# Patient Record
Sex: Male | Born: 1995 | Race: White | Hispanic: No | Marital: Single | Smoking: Current some day smoker
Health system: Southern US, Community
[De-identification: ages and names within clinical notes are randomized; demographics above are authoritative.]

## PROBLEM LIST (undated history)

## (undated) DIAGNOSIS — Z22322 Carrier or suspected carrier of Methicillin resistant Staphylococcus aureus: Secondary | ICD-10-CM

## (undated) DIAGNOSIS — I499 Cardiac arrhythmia, unspecified: Secondary | ICD-10-CM

## (undated) DIAGNOSIS — H5500 Unspecified nystagmus: Secondary | ICD-10-CM

## (undated) HISTORY — DX: Carrier or suspected carrier of methicillin resistant Staphylococcus aureus: Z22.322

## (undated) HISTORY — PX: EYE SURGERY: SHX253

## (undated) HISTORY — DX: Unspecified nystagmus: H55.00

## (undated) HISTORY — DX: Cardiac arrhythmia, unspecified: I49.9

---
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
## 2016-03-17 ENCOUNTER — Ambulatory Visit (INDEPENDENT_AMBULATORY_CARE_PROVIDER_SITE_OTHER): Payer: Self-pay | Admitting: Family Medicine

## 2016-03-17 ENCOUNTER — Encounter: Payer: Self-pay | Admitting: Family Medicine

## 2016-03-17 VITALS — BP 132/72 | HR 85 | Temp 98.3°F | Ht 71.0 in | Wt 210.8 lb

## 2016-03-17 DIAGNOSIS — R232 Flushing: Secondary | ICD-10-CM

## 2016-03-17 DIAGNOSIS — F4010 Social phobia, unspecified: Secondary | ICD-10-CM

## 2016-03-17 DIAGNOSIS — F410 Panic disorder [episodic paroxysmal anxiety] without agoraphobia: Secondary | ICD-10-CM | POA: Insufficient documentation

## 2016-03-17 DIAGNOSIS — F32A Depression, unspecified: Secondary | ICD-10-CM | POA: Insufficient documentation

## 2016-03-17 DIAGNOSIS — F329 Major depressive disorder, single episode, unspecified: Secondary | ICD-10-CM

## 2016-03-17 LAB — BASIC METABOLIC PANEL
BUN: 11 mg/dL (ref 6–23)
CHLORIDE: 102 meq/L (ref 96–112)
CO2: 30 mEq/L (ref 19–32)
CREATININE: 0.82 mg/dL (ref 0.40–1.50)
Calcium: 9.8 mg/dL (ref 8.4–10.5)
GFR: 128.28 mL/min (ref >60.00)
GLUCOSE: 84 mg/dL (ref 70–99)
POTASSIUM: 4.1 meq/L (ref 3.5–5.1)
Sodium: 140 mEq/L (ref 135–145)

## 2016-03-17 LAB — TSH: TSH: 1.81 u[IU]/mL (ref 0.40–5.00)

## 2016-03-17 MED ORDER — PAROXETINE HCL 20 MG PO TABS: 20.0000 mg | ORAL_TABLET | Freq: Every day | ORAL | Status: DC

## 2016-03-17 MED ORDER — PROPRANOLOL HCL 10 MG PO TABS: 10.0000 mg | ORAL_TABLET | Freq: Two times a day (BID) | ORAL | Status: DC

## 2016-03-17 NOTE — Progress Notes (Signed)
[facility] at [facility] [address] [place], [place] [address] [phone]  Date:  03/17/2016   Name:  Xavier Olson   DOB:  March 16, 1996   MRN:  [id number]  PCP:  Abbe Amsterdam, MD    Chief Complaint: New Patient (Initial Visit)   History of Present Illness:  Xavier Olson is a 20 y.o. very pleasant male patient who presents with the following:  Here today as a new patient with concern of anxiety.  His mother is a pt of mine who I last saw a couple of years ago.  I have also seen his dad who unfortunately has a drug abuse and addiction problem.  Her mother and dad are currently getting divorced.  Xavier Olson is here today with his mom April who is supportive.   He may get what sounds like anxiety or panic attacks- he will develop some blotches on his skin if he gets anxious. He will feel burning and itching, a feeling of panic, like his heart is racing and like he just has to leave the situation immediately.  He will feel like he is outside of his body.  People notice his turning red, etc and he is embarrassed. This occurs most often in social situations such as school or youth group- he has stopped attending youth group which he always enjoyed.   He is getting this sort of attack once a day or so.  It can be due to a stressful situation or for no reason at all.    This has also occurred with driving which is a worry for his family  He has noted sx of anxiety - more social anxiety- for the last 3-4 years.  However this has gotten worse over the last several months.    He does feel like he may have some depression as well. He notes less interest in things that he used to enjoy.  His mother notes that Xavier Olson's twin brother is "living the life that Xavier Olson wants-" he has already graduated from high school and is moving on to adulthood while Xavier Olson had to repeat his senior year.   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  He is at school right  now- he is a senior this year.   He is still able to enjoy activities   He has had some heart palpitations but has seen cardiology in the past and found to be ok  Never had any LOC  His mom is his main support system.    Admits that he may engage in scratching or pinching his skin as the pain "make me feel calmer." Discussed any suicidal thoughts- Xavier Olson assures me and his mother that he is safe.  He may have fleeting thoughts of self harm but has no plans or intent to carry out any plan.   There are firearms at home but per his mother no ammunition.  I urged her to remove all firearms from the home at least for the time being.    There are no active problems to display for this patient.   Past Medical History  Diagnosis Date  . Nystagmus   . Irregular heart beat   . MRSA (methicillin resistant staph aureus) culture positive     Past Surgical History  Procedure Laterality Date  . Eye surgery  age 9    Social History  Substance Use Topics  . Smoking status: Never Smoker   . Smokeless tobacco: Never Used  .  Alcohol Use: No    Family History  Problem Relation Age of Onset  . Thyroid disease Maternal Grandmother   . Heart Problems Maternal Grandfather   . Thyroid disease Maternal Grandfather   . Thyroid disease Paternal Grandmother   . Heart disease Paternal Grandfather   . Thyroid disease Paternal Grandfather     No Known Allergies  Medication list has been reviewed and updated.  No current outpatient prescriptions on file prior to visit.   No current facility-administered medications on file prior to visit.    Review of Systems:  As per HPI- otherwise negative. No history of asthma  Physical Examination: Filed Vitals:   03/17/16 0951  BP: 132/72  Pulse: 85  Temp: 98.3 F (36.8 C)   Filed Vitals:   03/17/16 0951  Height:  (1.803 m)  Weight: 210 lb 12.8 oz (95.618 kg)   Body mass index is 29.41 kg/(m^2). Ideal Body Weight: Weight in (lb) to  have BMI = 25: 178.9  GEN: WDWN, NAD, Non-toxic, A & O x 3, well appearing but somewhat nervous young man accompanied by his mother.  Their interactions seem open and supportive HEENT: Atraumatic, Normocephalic. Neck supple. No masses, No LAD. Ears and Nose: No external deformity. CV: RRR, No M/G/R. No JVD. No thrill. No extra heart sounds. PULM: CTA B, no wheezes, crackles, rhonchi. No retractions. No resp. distress. No accessory muscle use. EXTR: No c/c/e NEURO Normal gait.  PSYCH: Normally interactive. Conversant.  Results for orders placed or performed in visit on 03/17/16  TSH  Result Value Ref Range   TSH 1.81 0.40 - 5.00 uIU/mL  Basic Metabolic Panel (BMET)  Result Value Ref Range   Sodium 140 135 - 145 mEq/L   Potassium 4.1 3.5 - 5.1 mEq/L   Chloride 102 96 - 112 mEq/L   CO2 30 19 - 32 mEq/L   Glucose, Bld 84 70 - 99 mg/dL   BUN 11 6 - 23 mg/dL   Creatinine, Ser 1.61 0.40 - 1.50 mg/dL   Calcium 9.8 8.4 - 09.6 mg/dL   GFR 045.40 >98.11 mL/min     Assessment and Plan: Panic attacks - Plan: PARoxetine (PAXIL) 20 MG tablet, propranolol (INDERAL) 10 MG tablet  Social anxiety disorder - Plan: PARoxetine (PAXIL) 20 MG tablet, propranolol (INDERAL) 10 MG tablet  Facial flushing - Plan: TSH, Basic Metabolic Panel (BMET)  Depression - Plan: PARoxetine (PAXIL) 20 MG tablet   Xavier Olson is here today to discuss social anxiety/ panic disorder and depression.   He has been through some family and personal stressors as his parents are splitting up- due at least in part to his father's drug problems.  In addition he is repeating 12th grade Decided to start paxil 20 mg for panic disorder/ depression and also prn propranolol to use for his vasomotor symptoms.  I hope that this will help him control his facial flushing which is very embarrassing to him.   Discussed risk of increased suicidal thoughts with SSRIs in young people with pt and his mother.  Xavier Olson agrees to seek help right away if  this is an issue, and his mother plans to watch him carefully They will contact me in one week with an update- sooner if any other concerns  Meds ordered this encounter  Medications  . PARoxetine (PAXIL) 20 MG tablet    Sig: Take 1 tablet (20 mg total) by mouth daily.    Dispense:  30 tablet    Refill:  3  .  propranolol (INDERAL) 10 MG tablet    Sig: Take 1 tablet (10 mg total) by mouth 2 (two) times daily. Use prn for shakiness    Dispense:  60 tablet    Refill:  3     Signed Abbe AmsterdamJessica Copland, MD

## 2016-03-17 NOTE — Patient Instructions (Signed)
We are going to start you on paxil 20 mg once a day to help with depression and anxiety You can also use the propranolol up to twice a day as needed for "social anxiety" or before going into a social situation We will do some basic labs today to make sure there is no other apparent reason for your symptoms Please set up your mychart and contact me with an update in one week  Remember if you have any suicidal thoughts or plans please contact someone for help- family, myself of the police

## 2016-03-21 ENCOUNTER — Telehealth: Payer: Self-pay | Admitting: Family Medicine

## 2016-03-21 NOTE — Telephone Encounter (Signed)
[facility] I need more details, will try back later.  Tried another number and did reach her- overall Ripken is doing better, he is not breaking out in the rash/ flushing. However he has had 3 minor, easily controlled nosebleeds- they are not sure if this is related.  Advised that this is possible but seems unlikely as he has only been on the medications for a few days.  Would advise to try vaseline or another moisturizer in his nose.  They will cut back on the paxil to 10 mg and keep me closely apprised as to his progress.  If any further bleeds they will let me know right away

## 2016-03-21 NOTE — Telephone Encounter (Signed)
Caller name:April Relationship to patient:mother Can be reached:[phone] Pharmacy:  Reason for call:Patient saw Dr Jeanella Crazeoplan on Monday  Started the paxil and blood pressure med on Monday evening.  He has gotten no sleep this week.  Should he stop the meds or one of the meds  She says to leave her a message she is out of town

## 2016-06-09 ENCOUNTER — Emergency Department (HOSPITAL_BASED_OUTPATIENT_CLINIC_OR_DEPARTMENT_OTHER)
Admission: EM | Admit: 2016-06-09 | Discharge: 2016-06-09 | Disposition: A | Discharge status: Home / Self Care | Payer: Self-pay | Attending: Emergency Medicine | Admitting: Emergency Medicine

## 2016-06-09 ENCOUNTER — Encounter (HOSPITAL_BASED_OUTPATIENT_CLINIC_OR_DEPARTMENT_OTHER): Payer: Self-pay | Admitting: Emergency Medicine

## 2016-06-09 ENCOUNTER — Other Ambulatory Visit: Payer: Self-pay

## 2016-06-09 ENCOUNTER — Ambulatory Visit (INDEPENDENT_AMBULATORY_CARE_PROVIDER_SITE_OTHER): Payer: Self-pay | Admitting: Family Medicine

## 2016-06-09 VITALS — BP 131/64 | HR 121 | Temp 98.6°F | Ht 71.0 in | Wt 225.4 lb

## 2016-06-09 DIAGNOSIS — L255 Unspecified contact dermatitis due to plants, except food: Secondary | ICD-10-CM

## 2016-06-09 DIAGNOSIS — M7989 Other specified soft tissue disorders: Secondary | ICD-10-CM

## 2016-06-09 DIAGNOSIS — F4010 Social phobia, unspecified: Secondary | ICD-10-CM

## 2016-06-09 DIAGNOSIS — L259 Unspecified contact dermatitis, unspecified cause: Secondary | ICD-10-CM | POA: Insufficient documentation

## 2016-06-09 DIAGNOSIS — F410 Panic disorder [episodic paroxysmal anxiety] without agoraphobia: Secondary | ICD-10-CM

## 2016-06-09 DIAGNOSIS — F32A Depression, unspecified: Secondary | ICD-10-CM

## 2016-06-09 DIAGNOSIS — R000 Tachycardia, unspecified: Secondary | ICD-10-CM | POA: Insufficient documentation

## 2016-06-09 DIAGNOSIS — F329 Major depressive disorder, single episode, unspecified: Secondary | ICD-10-CM

## 2016-06-09 DIAGNOSIS — R5383 Other fatigue: Secondary | ICD-10-CM | POA: Insufficient documentation

## 2016-06-09 LAB — CBC WITH DIFFERENTIAL/PLATELET
Basophils Absolute: 0 10*3/uL (ref 0.0–0.1)
Basophils Relative: 0 %
Eosinophils Absolute: 0.3 10*3/uL (ref 0.0–0.7)
Eosinophils Relative: 3 %
HEMATOCRIT: 52.5 % — AB (ref 39.0–52.0)
HEMOGLOBIN: 18.4 g/dL — AB (ref 13.0–17.0)
LYMPHS ABS: 1.4 10*3/uL (ref 0.7–4.0)
Lymphocytes Relative: 12 %
MCH: 31.5 pg (ref 26.0–34.0)
MCHC: 35 g/dL (ref 30.0–36.0)
MCV: 89.9 fL (ref 78.0–100.0)
MONO ABS: 1.2 10*3/uL — AB (ref 0.1–1.0)
MONOS PCT: 10 %
NEUTROS ABS: 8.7 10*3/uL — AB (ref 1.7–7.7)
NEUTROS PCT: 75 %
Platelets: 206 10*3/uL (ref 150–400)
RBC: 5.84 MIL/uL — ABNORMAL HIGH (ref 4.22–5.81)
RDW: 12.7 % (ref 11.5–15.5)
WBC: 11.6 10*3/uL — ABNORMAL HIGH (ref 4.0–10.5)

## 2016-06-09 LAB — COMPREHENSIVE METABOLIC PANEL
ALK PHOS: 55 U/L (ref 38–126)
ALT: 22 U/L (ref 17–63)
ANION GAP: 9 (ref 5–15)
AST: 24 U/L (ref 15–41)
Albumin: 4.4 g/dL (ref 3.5–5.0)
BILIRUBIN TOTAL: 1 mg/dL (ref 0.3–1.2)
BUN: 11 mg/dL (ref 6–20)
CALCIUM: 9.1 mg/dL (ref 8.9–10.3)
CO2: 30 mmol/L (ref 22–32)
Chloride: 98 mmol/L — ABNORMAL LOW (ref 101–111)
Creatinine, Ser: 0.92 mg/dL (ref 0.61–1.24)
GLUCOSE: 91 mg/dL (ref 65–99)
Potassium: 3.7 mmol/L (ref 3.5–5.1)
Sodium: 137 mmol/L (ref 135–145)
TOTAL PROTEIN: 7.7 g/dL (ref 6.5–8.1)

## 2016-06-09 MED ORDER — PAROXETINE HCL 20 MG PO TABS: 20.0000 mg | ORAL_TABLET | Freq: Every day | ORAL | Status: DC

## 2016-06-09 MED ORDER — METHYLPREDNISOLONE SODIUM SUCC 125 MG IJ SOLR
125.0000 mg | Freq: Once | INTRAMUSCULAR | Status: AC
  Administered 2016-06-09: 125 mg via INTRAVENOUS
  Filled 2016-06-09: qty 2

## 2016-06-09 MED ORDER — PROPRANOLOL HCL 10 MG PO TABS: 10.0000 mg | ORAL_TABLET | Freq: Two times a day (BID) | ORAL | Status: DC

## 2016-06-09 MED ORDER — HYDROXYZINE HCL 25 MG PO TABS: 25.0000 mg | ORAL_TABLET | Freq: Four times a day (QID) | ORAL | Status: DC | PRN

## 2016-06-09 MED ORDER — DIPHENHYDRAMINE HCL 50 MG/ML IJ SOLN
25.0000 mg | Freq: Once | INTRAMUSCULAR | Status: AC
  Administered 2016-06-09: 25 mg via INTRAVENOUS
  Filled 2016-06-09: qty 1

## 2016-06-09 MED ORDER — PREDNISONE 20 MG PO TABS: 60.0000 mg | ORAL_TABLET | Freq: Every day | ORAL | Status: DC

## 2016-06-09 MED ORDER — SODIUM CHLORIDE 0.9 % IV BOLUS (SEPSIS)
2000.0000 mL | Freq: Once | INTRAVENOUS | Status: AC
  Administered 2016-06-09: 2000 mL via INTRAVENOUS

## 2016-06-09 MED ORDER — FAMOTIDINE IN NACL 20-0.9 MG/50ML-% IV SOLN
20.0000 mg | Freq: Once | INTRAVENOUS | Status: AC
  Administered 2016-06-09: 20 mg via INTRAVENOUS
  Filled 2016-06-09: qty 50

## 2016-06-09 MED FILL — hydrOXYzine HCL 25 MG TABS: 25 | 5 days supply | Qty: 20 | Fill #0

## 2016-06-09 MED FILL — predniSONE 50 MG TABS: 50 | 7 days supply | Qty: 7 | Fill #0

## 2016-06-09 NOTE — Patient Instructions (Signed)
Please proceed downstairs to the ER for further evaluation  I hope that you feel better very soon!

## 2016-06-09 NOTE — Progress Notes (Signed)
Pre visit review using our clinic review tool, if applicable. No additional management support is needed unless otherwise documented below in the visit note. 

## 2016-06-09 NOTE — ED Provider Notes (Signed)
CSN: [id number]     Arrival date & time 06/09/16  1337 History   First MD Initiated Contact with Patient 06/09/16 1359     Chief Complaint  Patient presents with  . Rash     (Consider location/radiation/quality/duration/timing/severity/associated sxs/prior Treatment) HPI Patient transferred from primary care office upstairs. Has had 5 days of worsening rash. 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 It then spread to bilateral arms and lower extremities. Patient describes the rash as burning. This had decreased by mouth intake and appetite. Has been using over-the-counter steroid cream. Noted to be tachycardic by primary physician. Patient states he has not been able to take his regular medication for several days which includes propranolol and Paxil for anxiety. Denies any difficulty breathing or intraoral swelling. Past Medical History  Diagnosis Date  . Nystagmus   . Irregular heart beat   . MRSA (methicillin resistant staph aureus) culture positive    Past Surgical History  Procedure Laterality Date  . Eye surgery  age 54   Family History  Problem Relation Age of Onset  . Thyroid disease Maternal Grandmother   . Heart Problems Maternal Grandfather   . Thyroid disease Maternal Grandfather   . Thyroid disease Paternal Grandmother   . Heart disease Paternal Grandfather   . Thyroid disease Paternal Grandfather    Social History  Substance Use Topics  . Smoking status: Never Smoker   . Smokeless tobacco: Never Used  . Alcohol Use: No    Review of Systems  Constitutional: Positive for appetite change and fatigue. Negative for fever and chills.  HENT: Negative for facial swelling.   Respiratory: Negative for cough, shortness of breath, wheezing and stridor.   Cardiovascular: Negative for chest pain.  Gastrointestinal: Negative for nausea, vomiting, abdominal pain and diarrhea.  Musculoskeletal: Negative for back pain.  Skin: Positive for rash.  Neurological:  Negative for dizziness, weakness, light-headedness, numbness and headaches.  All other systems reviewed and are negative.     Allergies  Review of patient's allergies indicates no known allergies.  Home Medications   Prior to Admission medications   Medication Sig Start Date End Date Taking? Authorizing Provider  hydrOXYzine (ATARAX/VISTARIL) 25 MG tablet Take 1 tablet (25 mg total) by mouth every 6 (six) hours as needed for anxiety or itching. 06/09/16   Loren Racer, MD  PARoxetine (PAXIL) 20 MG tablet Take 1 tablet (20 mg total) by mouth daily. 06/09/16   Gwenlyn Found Copland, MD  predniSONE (DELTASONE) 20 MG tablet Take 3 tablets (60 mg total) by mouth daily. 06/09/16   Loren Racer, MD  propranolol (INDERAL) 10 MG tablet Take 1 tablet (10 mg total) by mouth 2 (two) times daily. Use prn for shakiness 06/09/16   Gwenlyn Found Copland, MD   BP 134/69 mmHg  Pulse 119  Temp(Src) 99.5 F (37.5 C)  Resp 21  Ht  (1.803 m)  Wt 225 lb (102.059 kg)  BMI 31.39 kg/m2  SpO2 100% Physical Exam  Constitutional: He is oriented to person, place, and time. He appears well-developed and well-nourished. No distress.  HENT:  Head: Normocephalic and atraumatic.  Mouth/Throat: Oropharynx is clear and moist. No oropharyngeal exudate.  Eyes: EOM are normal. Pupils are equal, round, and reactive to light.  Neck: Normal range of motion. Neck supple.  Cardiovascular: Regular rhythm.  Exam reveals no gallop and no friction rub.   No murmur heard. Tachycardia  Pulmonary/Chest: Effort normal and breath sounds normal. No respiratory distress. He has no wheezes.  He has no rales. He exhibits no tenderness.  Abdominal: Soft. Bowel sounds are normal. He exhibits no distension and no mass. There is no tenderness. There is no rebound and no guarding.  Musculoskeletal: Normal range of motion. He exhibits no edema or tenderness.  Neurological: He is alert and oriented to person, place, and time.  5/5 motor in  all extremities. Sensation is fully intact.  Skin: Skin is warm and dry. Rash noted. No erythema.  Patient has erythematous maculopapular rash covering bilateral lower extremities to the mid ankle and mid thigh. Similar rash to bilateral upper extremities. Patient has several spots of erythematous macular papular rash to the face. There is no intraoral or facial swelling. The rash the face appears less erythematous and more scaly than the other rashes. No obvious secondary infection  Psychiatric:  Anxious appearing  Nursing note and vitals reviewed.   ED Course  Procedures (including critical care time) Labs Review Labs Reviewed  CBC WITH DIFFERENTIAL/PLATELET - Abnormal; Notable for the following:    WBC 11.6 (*)    RBC 5.84 (*)    Hemoglobin 18.4 (*)    HCT 52.5 (*)    Neutro Abs 8.7 (*)    Monocytes Absolute 1.2 (*)    All other components within normal limits  COMPREHENSIVE METABOLIC PANEL - Abnormal; Notable for the following:    Chloride 98 (*)    All other components within normal limits    Imaging Review No results found. I have personally reviewed and evaluated these images and lab results as part of my medical decision-making.   EKG Interpretation   Date/Time:  Monday June 09 2016 14:32:06 EDT Ventricular Rate:  114 PR Interval:    QRS Duration: 94 QT Interval:  317 QTC Calculation: 437 R Axis:   123 Text Interpretation:  Sinus tachycardia Consider right atrial enlargement  Right axis deviation Borderline T abnormalities, inferior leads Borderline  ST elevation, anterolateral leads Confirmed by Ranae PalmsYELVERTON  MD, Angelica Wix  ([id number]) on 06/09/2016 2:43:47 PM Also confirmed by Ranae PalmsYELVERTON  MD, Alazay Leicht  ([id number]), editor [place], Jola BabinskiMarilyn [phone]([id number])  on 06/09/2016 2:48:17 PM      MDM   Final diagnoses:  Contact dermatitis  Tachycardia    Patient with severe contact dermatitis. We'll treat with systemic steroids and antihistamines. We'll check basic labs and give IV fluids.  Heart rate elevation may be due to dehydration, rebound tachycardia from propranolol, anxiety or infection.  Signed out to oncoming emergency physician pending recheck after IV fluid.  Loren Raceravid Dedria Endres, MD 06/11/16 1021

## 2016-06-09 NOTE — Progress Notes (Signed)
[facility] at [facility] [address], [address] [place], [place] [address] [phone]  Date:  06/09/2016   Name:  Xavier Olson   DOB:  1996-05-21   MRN:  [id number]  PCP:  Abbe Amsterdam, MD    Chief Complaint: Posion Lajoyce Corners   History of Present Illness:  Xavier Olson is a 20 y.o. 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  He noted onset of itching that night.  Started on his face and neck They tried a steroid cream on his face and neck which did help to clear up this area but it has since spread to most of his body including his legs, arms, and trunk.  So far no rash on the genitals "but it is heading that way"  The first rash was on the side of his face  His legs are swollen and painful- he cannot walk without a lot of calf pain.  His mom has him in a WC today He has felt nauseated but they have not noted any fever He has not had this in the past- has not had any rhus derm reaction that they can recall He does not think he was exposed to any other chemicals or irritants while working  He notes that his calves are quite swollen and painful. However his feet are not numb  He is on paxil and inderal for anxiety and panic attacks  Patient Active Problem List   Diagnosis Date Noted  . Social anxiety disorder 03/17/2016  . Panic attacks 03/17/2016  . Facial flushing 03/17/2016  . Depression 03/17/2016    Past Medical History  Diagnosis Date  . Nystagmus   . Irregular heart beat   . MRSA (methicillin resistant staph aureus) culture positive     Past Surgical History  Procedure Laterality Date  . Eye surgery  age 89    Social History  Substance Use Topics  . Smoking status: Never Smoker   . Smokeless tobacco: Never  Used  . Alcohol Use: No    Family History  Problem Relation Age of Onset  . Thyroid disease Maternal Grandmother   . Heart Problems Maternal Grandfather   . Thyroid disease Maternal Grandfather   . Thyroid disease Paternal Grandmother   . Heart disease Paternal Grandfather   . Thyroid disease Paternal Grandfather     No Known Allergies  Medication list has been reviewed and updated.  Current Outpatient Prescriptions on File Prior to Visit  Medication Sig Dispense Refill  . PARoxetine (PAXIL) 20 MG tablet Take 1 tablet (20 mg total) by mouth daily. 30 tablet 3  . propranolol (INDERAL) 10 MG tablet Take 1 tablet (10 mg total) by mouth 2 (two) times daily. Use prn for shakiness 60 tablet 3   No current facility-administered medications on file prior to visit.    Review of Systems:  As per HPI- otherwise negative.   Physical Examination: Filed Vitals:   06/09/16 1312  BP: 131/64  Pulse: 121  Temp: 98.6 F (37 C)   Filed Vitals:   06/09/16 1312  Weight: 225 lb 6.4 oz (102.241 kg)   Body mass index is 31.45 kg/(m^2). Ideal Body Weight:    GEN: WDWN, NAD, Non-toxic, A & O x 3, sitting in WC, does  not appear to feel well HEENT: Atraumatic, Normocephalic. Neck supple. No masses, No LAD.  Oropharynx wnl, no angioedema Ears and Nose: No external deformity. CV: RRR, No M/G/R. No JVD. No thrill. No extra heart sounds. PULM: CTA B, no wheezes, crackles, rhonchi. No retractions. No resp. distress. No accessory muscle use. ABD: S, NT, ND. No rebound. No HSM. EXTR: No c/c NEURO Normal gait.  PSYCH: Normally interactive. Conversant. Not depressed or anxious appearing.  Calm demeanor.  He has a widespread rash c/w rhus derm or other topical allergic derm which is confluent over his lower legs and covers most of his arms.  Some on the tunk Significant edema of his bilateral calves, worse on the left Normal DP pulses of both feet   Assessment and Plan: Rhus  dermatitis  Social anxiety disorder - Plan: PARoxetine (PAXIL) 20 MG tablet, propranolol (INDERAL) 10 MG tablet  Panic attacks - Plan: PARoxetine (PAXIL) 20 MG tablet, propranolol (INDERAL) 10 MG tablet  Depression - Plan: PARoxetine (PAXIL) 20 MG tablet  Leg swelling  Refilled his paxil and inderal today unfortunatley Xavier Olson appears to have a severe dermatitis with tachycardia and mild systemic symptoms. Likely needs hydration and labs as well as steroids.  Referral to ED now for further care.    Signed Abbe AmsterdamJessica Copland, MD

## 2016-06-09 NOTE — ED Notes (Signed)
Pt states that Thursday evening he noticed a rash tried to self medicate at home but has gotten worse and spreading every where and his ankles are swollen

## 2016-06-09 NOTE — Discharge Instructions (Signed)
Contact Dermatitis Dermatitis is redness, soreness, and swelling (inflammation) of the skin. Contact dermatitis is a reaction to certain substances that touch the skin. There are two types of contact dermatitis:   Irritant contact dermatitis. This type is caused by something that irritates your skin, such as dry hands from washing them too much. This type does not require previous exposure to the substance for a reaction to occur. This type is more common.  Allergic contact dermatitis. This type is caused by a substance that you are allergic to, such as a nickel allergy or poison ivy. This type only occurs if you have been exposed to the substance (allergen) before. Upon a repeat exposure, your body reacts to the substance. This type is less common. CAUSES  Many different substances can cause contact dermatitis. Irritant contact dermatitis is most commonly caused by exposure to:   Makeup.   Soaps.   Detergents.   Bleaches.   Acids.   Metal salts, such as nickel.  Allergic contact dermatitis is most commonly caused by exposure to:   Poisonous plants.   Chemicals.   Jewelry.   Latex.   Medicines.   Preservatives in products, such as clothing.  RISK FACTORS This condition is more likely to develop in:   People who have jobs that expose them to irritants or allergens.  People who have certain medical conditions, such as asthma or eczema.  SYMPTOMS  Symptoms of this condition may occur anywhere on your body where the irritant has touched you or is touched by you. Symptoms include:  Dryness or flaking.   Redness.   Cracks.   Itching.   Pain or a burning feeling.   Blisters.  Drainage of small amounts of blood or clear fluid from skin cracks. With allergic contact dermatitis, there may also be swelling in areas such as the eyelids, mouth, or genitals.  DIAGNOSIS  This condition is diagnosed with a medical history and physical exam. A patch skin test  may be performed to help determine the cause. If the condition is related to your job, you may need to see an occupational medicine specialist. TREATMENT Treatment for this condition includes figuring out what caused the reaction and protecting your skin from further contact. Treatment may also include:   Steroid creams or ointments. Oral steroid medicines may be needed in more severe cases.  Antibiotics or antibacterial ointments, if a skin infection is present.  Antihistamine lotion or an antihistamine taken by mouth to ease itching.  A bandage (dressing). HOME CARE INSTRUCTIONS Skin Care  Moisturize your skin as needed.   Apply cool compresses to the affected areas.  Try taking a bath with:  Epsom salts. Follow the instructions on the packaging. You can get these at your local pharmacy or grocery store.  Baking soda. Pour a small amount into the bath as directed by your health care provider.  Colloidal oatmeal. Follow the instructions on the packaging. You can get this at your local pharmacy or grocery store.  Try applying baking soda paste to your skin. Stir water into baking soda until it reaches a paste-like consistency.  Do not scratch your skin.  Bathe less frequently, such as every other day.  Bathe in lukewarm water. Avoid using hot water. Medicines  Take or apply over-the-counter and prescription medicines only as told by your health care provider.   If you were prescribed an antibiotic medicine, take or apply your antibiotic as told by your health care provider. Do not stop using the   antibiotic even if your condition starts to improve. General Instructions  Keep all follow-up visits as told by your health care provider. This is important.  Avoid the substance that caused your reaction. If you do not know what caused it, keep a journal to try to track what caused it. Write down:  What you eat.  What cosmetic products you use.  What you drink.  What  you wear in the affected area. This includes jewelry.  If you were given a dressing, take care of it as told by your health care provider. This includes when to change and remove it. SEEK MEDICAL CARE IF:   Your condition does not improve with treatment.  Your condition gets worse.  You have signs of infection such as swelling, tenderness, redness, soreness, or warmth in the affected area.  You have a fever.  You have new symptoms. SEEK IMMEDIATE MEDICAL CARE IF:   You have a severe headache, neck pain, or neck stiffness.  You vomit.  You feel very sleepy.  You notice red streaks coming from the affected area.  Your bone or joint underneath the affected area becomes painful after the skin has healed.  The affected area turns darker.  You have difficulty breathing.   This information is not intended to replace advice given to you by your health care provider. Make sure you discuss any questions you have with your health care provider.   Document Released: 12/05/2000 Document Revised: 08/29/2015 Document Reviewed: 04/25/2015 Elsevier Interactive Patient Education 2016 [employer]  

## 2016-06-12 ENCOUNTER — Encounter: Payer: Self-pay | Admitting: Family Medicine

## 2016-06-12 ENCOUNTER — Ambulatory Visit (INDEPENDENT_AMBULATORY_CARE_PROVIDER_SITE_OTHER): Payer: Self-pay | Admitting: Family Medicine

## 2016-06-12 VITALS — BP 102/68 | HR 82 | Temp 98.4°F | Ht 71.0 in | Wt 226.6 lb

## 2016-06-12 DIAGNOSIS — L255 Unspecified contact dermatitis due to plants, except food: Secondary | ICD-10-CM

## 2016-06-12 DIAGNOSIS — M25472 Effusion, left ankle: Secondary | ICD-10-CM

## 2016-06-12 NOTE — Progress Notes (Signed)
Pre visit review using our clinic review tool, if applicable. No additional management support is needed unless otherwise documented below in the visit note. 

## 2016-06-12 NOTE — Patient Instructions (Addendum)
It was good to see you today- I am glad that you are feeling so much better.  Let me know if your ankle does not get better in the next few days please let me know. Try propping it up when you can.  Take care!

## 2016-06-12 NOTE — Progress Notes (Signed)
[facility] [address] [place], [place] [address] [phone]  Date:  06/12/2016   Name:  Xavier Olson [place]   DOB:  1996-02-07   MRN:  [id number]  PCP:  Abbe AmsterdamOPLAND,JESSICA, MD    Chief Complaint: Foot Swelling   History of Present Illness:  Xavier Olson Wellbrock is a 20 y.o. very pleasant male patient who presents with the following:  He was here on Monday with severe PI- he was treated with systemic steroids and antihistamines, IV fluids.   He was given prednisone 60 for 6 days, atarax.    He is overall feeling much better- itching is tolerable.  He feels "great" with his steroids.  Overall much better He noted some swelling of the left foot and ankle this am- was present when he woke up. Has gotten a bit better as the day went on he thinks  The ankle does not hurt, he can walk again No fever He is eating well He is back on his regular meds as well.   Patient Active Problem List   Diagnosis Date Noted  . Social anxiety disorder 03/17/2016  . Panic attacks 03/17/2016  . Facial flushing 03/17/2016  . Depression 03/17/2016    Past Medical History  Diagnosis Date  . Nystagmus   . Irregular heart beat   . MRSA (methicillin resistant staph aureus) culture positive     Past Surgical History  Procedure Laterality Date  . Eye surgery  age 52    Social History  Substance Use Topics  . Smoking status: Never Smoker   . Smokeless tobacco: Never Used  . Alcohol Use: No    Family History  Problem Relation Age of Onset  . Thyroid disease Maternal Grandmother   . Heart Problems Maternal Grandfather   . Thyroid disease Maternal Grandfather   . Thyroid disease Paternal Grandmother   . Heart disease Paternal Grandfather   . Thyroid disease Paternal Grandfather     No Known Allergies  Medication list has been reviewed and updated.  Current Outpatient Prescriptions on File Prior to Visit  Medication Sig Dispense  Refill  . hydrOXYzine (ATARAX/VISTARIL) 25 MG tablet Take 1 tablet (25 mg total) by mouth every 6 (six) hours as needed for anxiety or itching. 20 tablet 0  . PARoxetine (PAXIL) 20 MG tablet Take 1 tablet (20 mg total) by mouth daily. 30 tablet 3  . predniSONE (DELTASONE) 20 MG tablet Take 3 tablets (60 mg total) by mouth daily. 15 tablet 0  . propranolol (INDERAL) 10 MG tablet Take 1 tablet (10 mg total) by mouth 2 (two) times daily. Use prn for shakiness 60 tablet 3   No current facility-administered medications on file prior to visit.    Review of Systems:  As per HPI- otherwise negative. NKI   Physical Examination: Filed Vitals:   06/12/16 1600  BP: 102/68  Pulse: 82  Temp: 98.4 F (36.9 C)   Filed Vitals:   06/12/16 1600  Height: 5\' 11"  (1.803 m)  Weight: 226 lb 9.6 oz (102.785 kg)   Body mass index is 31.62 kg/(m^2). Ideal Body Weight: Weight in (lb) to have BMI = 25: 178.9  GEN: WDWN, NAD, Non-toxic, A & O x 3, looks well.  Still has extensive PI rash but it is drying out HEENT: Atraumatic, Normocephalic. Neck supple. No masses, No LAD. Ears and Nose: No external deformity. CV: RRR, No M/G/R. No JVD. No thrill. No extra heart sounds.  PULM: CTA B, no wheezes, crackles, rhonchi. No retractions. No resp. distress. No accessory muscle use. ABD: S, NT, ND, +BS. No rebound. No HSM. EXTR: No c/c. Trace to 1+ edema in the left ankle. No tenderness with motion of the ankle.  Normal DP pulse, normal cap refill of the foot. No heat or redness  NEURO Normal gait.  PSYCH: Normally interactive. Conversant. Not depressed or anxious appearing.  Calm demeanor.    Assessment and Plan: Rhus dermatitis  Ankle swelling, left  Here today to follow-up PI; he is doing much better.  Will continue his prednisone.  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

## 2016-06-19 ENCOUNTER — Encounter: Payer: Self-pay | Admitting: Family Medicine

## 2016-06-19 ENCOUNTER — Telehealth: Payer: Self-pay | Admitting: Emergency Medicine

## 2016-06-19 MED ORDER — HYDROXYZINE HCL 25 MG PO TABS: 25.0000 mg | ORAL_TABLET | Freq: Four times a day (QID) | ORAL | Status: DC | PRN

## 2016-06-19 MED ORDER — PREDNISONE 20 MG PO TABS: 60.0000 mg | ORAL_TABLET | Freq: Every day | ORAL | Status: DC

## 2016-06-19 NOTE — Telephone Encounter (Signed)
error:[id number] ° °

## 2016-06-19 NOTE — Telephone Encounter (Signed)
Called pt's mother back. Informed her that refills for Prednisone and Hydroxyzine were [facility] to [facility]. Provider recommendations discussed with pt's mother. Pt's mother verbalized understanding.

## 2016-06-19 NOTE — Telephone Encounter (Signed)
Pt's mother April called in for pt. Pt went to work on Monday 6/26 and worked in the yard while being fully covered. Pt was only digging in the yard and did not come in contact with poison ivy as far as he knows but somehow is breaking out again with poison ivy.   Pt was seen on 6/22 for a severe case of poison ivy. Mom states that his current episode is not as bad yet but he does have several places where the rash is starting.   Pt's mother would like rx for Prednisone and Hydroxyzine to be sent in since this helped clear the poison ivy up the first time. Pt does not have insurance and would like to avoid having to come into the office. Pt will be going out of town and would like to treat this before it gets any worse.

## 2016-06-19 NOTE — Telephone Encounter (Signed)
OK to refill Prednisone and Hydroxyzine with same strength, same sig, same number. Have him take Hydroxyzine at least bid for next week as long it is not excessively sedating. If it is can do Cetirizine in am and Hydroxyzine in pm but cannot take within 6 hours of each other. Also Start Zantac/Rantidine 150 mg tab po bid for at least next week. Whenever exposed to poison wash with DAWN Dishwashing liquid in cool water, cleanse with Witch Hazel astringent. Wash all clothes exposed in hot water with detergent and distilled white vinegar.

## 2016-11-11 ENCOUNTER — Other Ambulatory Visit: Payer: Self-pay | Admitting: Emergency Medicine

## 2016-11-11 DIAGNOSIS — F410 Panic disorder [episodic paroxysmal anxiety] without agoraphobia: Secondary | ICD-10-CM

## 2016-11-11 DIAGNOSIS — F4010 Social phobia, unspecified: Secondary | ICD-10-CM

## 2016-11-11 MED ORDER — PAROXETINE HCL 20 MG PO TABS: 20.0000 mg | ORAL_TABLET | Freq: Every day | ORAL | 2 refills | Status: DC

## 2016-11-11 NOTE — Telephone Encounter (Signed)
[facility]. Sent refill for paroxetine 20 mg.

## 2017-04-17 ENCOUNTER — Other Ambulatory Visit: Payer: Self-pay | Admitting: Family Medicine

## 2017-04-17 DIAGNOSIS — F410 Panic disorder [episodic paroxysmal anxiety] without agoraphobia: Secondary | ICD-10-CM

## 2017-04-17 DIAGNOSIS — F4010 Social phobia, unspecified: Secondary | ICD-10-CM

## 2017-04-17 NOTE — Telephone Encounter (Signed)
Called and [facility] for April- I am glad to help with medications but will also need to see Xavier Olson soon as it has been nearly a year.  Also, would like to talk to him personally to see how he is doing as he is an adult.  Will try back later

## 2017-04-17 NOTE — Telephone Encounter (Signed)
Pt's mom April - [phone] called in to request a refill on her son's anxiety and bp medication. Mom says that she's not to sure of the names of the medications. Mom says that pt is currently without health insurance so if provider could prescribe a generic it would be helpful. Pt is also currently out of medication.    Pharmacy: [facility] on [address]

## 2017-04-19 NOTE — Telephone Encounter (Signed)
Called and spoke with his mom Xavier Olson.  Xavier Olson is not home and she is not quite sure of what he is taking currently.  He will be home tomorrow and I will try them again

## 2017-04-20 MED ORDER — PROPRANOLOL HCL 10 MG PO TABS: 10.0000 mg | ORAL_TABLET | Freq: Two times a day (BID) | ORAL | 3 refills | Status: DC

## 2017-04-20 MED ORDER — HYDROXYZINE HCL 25 MG PO TABS: 25.0000 mg | ORAL_TABLET | Freq: Four times a day (QID) | ORAL | 1 refills | Status: DC | PRN

## 2017-04-20 NOTE — Telephone Encounter (Signed)
Called back and was able to speak with Xavier Olson-   He has been out of his hydroxyzine and also the propranolol for about one month.   He is not sure if he ever took paxil for a significant amount of time- in any case he has not taken it recently He did feel like the hydroxyzine/ inderal were helpful for his anxiety and he would like to go back on these.  However, he feels like he is overall doing better in his work and social life and is happier overall   Asked him to see me in the next 2-3 months and he agrees to do so

## 2017-08-06 ENCOUNTER — Other Ambulatory Visit: Payer: Self-pay | Admitting: Family Medicine

## 2017-08-06 DIAGNOSIS — F410 Panic disorder [episodic paroxysmal anxiety] without agoraphobia: Secondary | ICD-10-CM

## 2017-08-06 DIAGNOSIS — F4010 Social phobia, unspecified: Secondary | ICD-10-CM

## 2017-09-28 ENCOUNTER — Other Ambulatory Visit: Payer: Self-pay | Admitting: Family Medicine

## 2017-09-28 DIAGNOSIS — F4010 Social phobia, unspecified: Secondary | ICD-10-CM

## 2017-09-28 DIAGNOSIS — F410 Panic disorder [episodic paroxysmal anxiety] without agoraphobia: Secondary | ICD-10-CM

## 2017-10-24 ENCOUNTER — Other Ambulatory Visit: Payer: Self-pay | Admitting: Family Medicine

## 2017-10-24 DIAGNOSIS — F4010 Social phobia, unspecified: Secondary | ICD-10-CM

## 2017-10-24 DIAGNOSIS — F410 Panic disorder [episodic paroxysmal anxiety] without agoraphobia: Secondary | ICD-10-CM

## 2017-12-08 NOTE — Progress Notes (Signed)
[facility] [address], [address] [place], [place] [address] [phone] [phone]  Date:  12/09/2017   Name:  Xavier Olson   DOB:  [birth year]   MRN:  [id number]  PCP:  Pearline Cablesopland, Jessica C, MD    Chief Complaint: No chief complaint on file.   History of Present Illness:  Xavier RisenCohle Bartha is a 21 y.o. very pleasant male patient who presents with the following:  I last saw him in summer of 2017 when he had a severe rhus derm reaction From my note in March of 2017:  Here today as a new patient with concern of anxiety.  His mother is a pt of mine who I last saw a couple of years ago.  I have also seen his dad who unfortunately has a drug abuse and addiction problem.  Her mother and dad are currently getting divorced.  Juanna CaoCohle is here today with his mom April who is supportive.  He may get what sounds like anxiety or panic attacks- he will develop some blotches on his skin if he gets anxious. He will feel burning and itching, a feeling of panic, like his heart is racing and like he just has to leave the situation immediately.  He will feel like he is outside of his body.  People notice his turning red, etc and he is embarrassed. This occurs most often in social situations such as school or youth group- he has stopped attending youth group which he always enjoyed.  He is getting this sort of attack once a day or so.  It can be due to a stressful situation or for no reason at all.   This has also occurred with driving which is a worry for his family He has noted sx of anxiety - more social anxiety- for the last 3-4 years.  However this has gotten worse over the last several months.   He does feel like he may have some depression as well. He notes less interest in things that he used to enjoy.  His mother notes that Berthel's twin brother is "living the life that Maxum wants-" he has already graduated from high school and is moving on to adulthood  while Chelsey had to repeat his senior year.  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  He is at school right now- he is a senior this year.   He is still able to enjoy activities  He has had some heart palpitations but has seen cardiology in the past and found to be ok  I treated him with paxil and propanolol at that time. However it does not sound like he used the paxil for long- he was using hydroxyzine and propranolol   He has been out of his meds for about 3 months. 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 He has noted that his anxiety is more frequent since he ran out of meds Also the medication seems to prevent the hot, itchy feeling rash/ flushing he will get on his chest when he is upset  No SI   Patient Active Problem List   Diagnosis Date Noted  . Social anxiety disorder 03/17/2016  . Panic attacks 03/17/2016  . Facial flushing 03/17/2016  .  Depression 03/17/2016    Past Medical History:  Diagnosis Date  . Irregular heart beat   . MRSA (methicillin resistant staph aureus) culture positive   . Nystagmus     Past Surgical History:  Procedure Laterality Date  . EYE SURGERY  age 7    Social History   Tobacco Use  . Smoking status: Never Smoker  . Smokeless tobacco: Never Used  Substance Use Topics  . Alcohol use: No    Alcohol/week: 0.0 oz  . Drug use: No    Family History  Problem Relation Age of Onset  . Thyroid disease Maternal Grandmother   . Heart Problems Maternal Grandfather   . Thyroid disease Maternal Grandfather   . Thyroid disease Paternal Grandmother   . Heart disease Paternal Grandfather   . Thyroid disease Paternal Grandfather     No Known Allergies  Medication list has been reviewed and updated.  Current Outpatient Medications on File Prior to Visit   Medication Sig Dispense Refill  . hydrOXYzine (ATARAX/VISTARIL) 25 MG tablet Take 1 tablet (25 mg total) by mouth every 6 (six) hours as needed for anxiety or itching. 40 tablet 0  . propranolol (INDERAL) 10 MG tablet TAKE 1 TABLET (10 MG TOTAL) BY MOUTH 2 (TWO) TIMES DAILY. USE AS NEEDED FOR FOR SHAKINESS 60 tablet 3   No current facility-administered medications on file prior to visit.     Review of Systems:  As per HPI- otherwise negative.   Physical Examination: Vitals:   12/09/17 1126  BP: (!) 142/56  Pulse: 66  Resp: 18  Temp: 98.2 F (36.8 C)  SpO2: 98%   Vitals:   12/09/17 1126  Weight: 256 lb (116.1 kg)  Height: 5\' 9"  (1.753 m)   Body mass index is 37.8 kg/m. Ideal Body Weight: Weight in (lb) to have BMI = 25: 168.9  GEN: WDWN, NAD, Non-toxic, A & O x 3, obese, otherwise looks well HEENT: Atraumatic, Normocephalic. Neck supple. No masses, No LAD. Ears and Nose: No external deformity. CV: RRR, No M/G/R. No JVD. No thrill. No extra heart sounds. PULM: CTA B, no wheezes, crackles, rhonchi. No retractions. No resp. distress. No accessory muscle use. EXTR: No c/c/e NEURO Normal gait.  PSYCH: Normally interactive. Conversant. Not depressed or anxious appearing.  Calm demeanor.    Assessment and Plan: Social anxiety disorder - Plan: hydrOXYzine (ATARAX/VISTARIL) 25 MG tablet, propranolol (INDERAL) 10 MG tablet  Panic attacks - Plan: hydrOXYzine (ATARAX/VISTARIL) 25 MG tablet, propranolol (INDERAL) 10 MG tablet  Follow-up after 18 month absence Treat with hydroxyzine and propranolol for his anxiety and to prevent panic attack  Asked him to come back in a couple of months so we can discuss in further detail.  He was about 10 minutes late for his 15 minute appt today- reminded him to come in plenty of time so we will have the full allotted time to discuss his concerns   Signed Abbe AmsterdamJessica Copland, MD

## 2017-12-09 ENCOUNTER — Encounter: Payer: Self-pay | Admitting: Family Medicine

## 2017-12-09 ENCOUNTER — Ambulatory Visit (INDEPENDENT_AMBULATORY_CARE_PROVIDER_SITE_OTHER): Payer: Self-pay | Admitting: Family Medicine

## 2017-12-09 DIAGNOSIS — F410 Panic disorder [episodic paroxysmal anxiety] without agoraphobia: Secondary | ICD-10-CM

## 2017-12-09 DIAGNOSIS — F4010 Social phobia, unspecified: Secondary | ICD-10-CM

## 2017-12-09 MED ORDER — PROPRANOLOL HCL 10 MG PO TABS: ORAL_TABLET | ORAL | 6 refills | Status: DC

## 2017-12-09 MED ORDER — HYDROXYZINE HCL 25 MG PO TABS: 25.0000 mg | ORAL_TABLET | Freq: Every day | ORAL | 6 refills | Status: DC | PRN

## 2017-12-09 NOTE — Patient Instructions (Signed)
Good to see you today- I am glad that you are doing ok! I refilled your propranolol which you can take twice a day if you like- or once is also ok; this should help with anxiety and flushing of your skin I also refilled the hydroxyzine for anxiety  If you have not already, please get your annual flu shot at your pharmacy Let's meet again in about 2 months to see how you are doing and talk in more detail Take care!

## 2018-06-24 ENCOUNTER — Other Ambulatory Visit: Payer: Self-pay | Admitting: Family Medicine

## 2018-06-24 DIAGNOSIS — F4010 Social phobia, unspecified: Secondary | ICD-10-CM

## 2018-06-24 DIAGNOSIS — F410 Panic disorder [episodic paroxysmal anxiety] without agoraphobia: Secondary | ICD-10-CM

## 2018-07-18 ENCOUNTER — Other Ambulatory Visit: Payer: Self-pay | Admitting: Family Medicine

## 2018-07-18 DIAGNOSIS — F4010 Social phobia, unspecified: Secondary | ICD-10-CM

## 2018-07-18 DIAGNOSIS — F410 Panic disorder [episodic paroxysmal anxiety] without agoraphobia: Secondary | ICD-10-CM

## 2018-08-17 ENCOUNTER — Other Ambulatory Visit: Payer: Self-pay | Admitting: Family Medicine

## 2018-08-17 DIAGNOSIS — F4010 Social phobia, unspecified: Secondary | ICD-10-CM

## 2018-08-17 DIAGNOSIS — F410 Panic disorder [episodic paroxysmal anxiety] without agoraphobia: Secondary | ICD-10-CM

## 2018-08-22 ENCOUNTER — Other Ambulatory Visit: Payer: Self-pay | Admitting: Family Medicine

## 2018-08-22 DIAGNOSIS — F4010 Social phobia, unspecified: Secondary | ICD-10-CM

## 2018-08-22 DIAGNOSIS — F410 Panic disorder [episodic paroxysmal anxiety] without agoraphobia: Secondary | ICD-10-CM

## 2018-08-27 ENCOUNTER — Other Ambulatory Visit: Payer: Self-pay | Admitting: Family Medicine

## 2018-08-27 DIAGNOSIS — F4010 Social phobia, unspecified: Secondary | ICD-10-CM

## 2018-08-27 DIAGNOSIS — F410 Panic disorder [episodic paroxysmal anxiety] without agoraphobia: Secondary | ICD-10-CM

## 2018-09-17 ENCOUNTER — Other Ambulatory Visit: Payer: Self-pay | Admitting: Family Medicine

## 2018-09-17 DIAGNOSIS — F4010 Social phobia, unspecified: Secondary | ICD-10-CM

## 2018-09-17 DIAGNOSIS — F410 Panic disorder [episodic paroxysmal anxiety] without agoraphobia: Secondary | ICD-10-CM

## 2018-09-23 ENCOUNTER — Other Ambulatory Visit: Payer: Self-pay | Admitting: Family Medicine

## 2018-09-23 DIAGNOSIS — F410 Panic disorder [episodic paroxysmal anxiety] without agoraphobia: Secondary | ICD-10-CM

## 2018-09-23 DIAGNOSIS — F4010 Social phobia, unspecified: Secondary | ICD-10-CM

## 2018-10-23 ENCOUNTER — Other Ambulatory Visit: Payer: Self-pay | Admitting: Family Medicine

## 2018-10-23 DIAGNOSIS — F4010 Social phobia, unspecified: Secondary | ICD-10-CM

## 2018-10-23 DIAGNOSIS — F410 Panic disorder [episodic paroxysmal anxiety] without agoraphobia: Secondary | ICD-10-CM

## 2018-11-22 ENCOUNTER — Other Ambulatory Visit: Payer: Self-pay | Admitting: Family Medicine

## 2018-11-22 DIAGNOSIS — F4010 Social phobia, unspecified: Secondary | ICD-10-CM

## 2018-11-22 DIAGNOSIS — F410 Panic disorder [episodic paroxysmal anxiety] without agoraphobia: Secondary | ICD-10-CM

## 2018-12-23 ENCOUNTER — Other Ambulatory Visit: Payer: Self-pay | Admitting: Family Medicine

## 2018-12-23 DIAGNOSIS — F410 Panic disorder [episodic paroxysmal anxiety] without agoraphobia: Secondary | ICD-10-CM

## 2018-12-23 DIAGNOSIS — F4010 Social phobia, unspecified: Secondary | ICD-10-CM

## 2019-01-22 ENCOUNTER — Other Ambulatory Visit: Payer: Self-pay | Admitting: Family Medicine

## 2019-01-22 DIAGNOSIS — F4010 Social phobia, unspecified: Secondary | ICD-10-CM

## 2019-01-22 DIAGNOSIS — F410 Panic disorder [episodic paroxysmal anxiety] without agoraphobia: Secondary | ICD-10-CM

## 2019-02-28 ENCOUNTER — Other Ambulatory Visit: Payer: Self-pay | Admitting: Family Medicine

## 2019-02-28 DIAGNOSIS — F4010 Social phobia, unspecified: Secondary | ICD-10-CM

## 2019-02-28 DIAGNOSIS — F410 Panic disorder [episodic paroxysmal anxiety] without agoraphobia: Secondary | ICD-10-CM

## 2019-03-20 ENCOUNTER — Other Ambulatory Visit: Payer: Self-pay | Admitting: Family Medicine

## 2019-03-20 DIAGNOSIS — F410 Panic disorder [episodic paroxysmal anxiety] without agoraphobia: Secondary | ICD-10-CM

## 2019-03-20 DIAGNOSIS — F4010 Social phobia, unspecified: Secondary | ICD-10-CM

## 2019-03-22 ENCOUNTER — Other Ambulatory Visit: Payer: Self-pay | Admitting: Family Medicine

## 2019-03-22 DIAGNOSIS — F410 Panic disorder [episodic paroxysmal anxiety] without agoraphobia: Secondary | ICD-10-CM

## 2019-03-22 DIAGNOSIS — F4010 Social phobia, unspecified: Secondary | ICD-10-CM

## 2019-03-24 ENCOUNTER — Other Ambulatory Visit: Payer: Self-pay

## 2019-03-24 ENCOUNTER — Ambulatory Visit (INDEPENDENT_AMBULATORY_CARE_PROVIDER_SITE_OTHER): Payer: 59 | Admitting: Family Medicine

## 2019-03-24 DIAGNOSIS — F4010 Social phobia, unspecified: Secondary | ICD-10-CM

## 2019-03-24 DIAGNOSIS — F410 Panic disorder [episodic paroxysmal anxiety] without agoraphobia: Secondary | ICD-10-CM

## 2019-03-24 MED ORDER — HYDROXYZINE HCL 25 MG PO TABS: 25.0000 mg | ORAL_TABLET | Freq: Every day | ORAL | 3 refills | Status: DC | PRN

## 2019-03-24 MED ORDER — PROPRANOLOL HCL 10 MG PO TABS: ORAL_TABLET | ORAL | 3 refills | Status: DC

## 2019-03-24 NOTE — Progress Notes (Signed)
[facility] [address] [place], [place] [address] [phone]  Date:  03/24/2019   Name:  Xavier Olson   DOB:  Nov 08, 1996   MRN:  [id number]  PCP:  Pearline Cables, MD    Chief Complaint: No chief complaint on file.   History of Present Illness:  Xavier Olson is a 23 y.o. very pleasant male patient who presents with the following:  webex visit today- pt ID confirmed with name and DOB Last seen by myself in December of 2018 with concern of social anxiety His mother April is my patient as well. At our last visit he was out of his medications.  I started him on Paxil but he did not take it for long.  He was taking hydroxyzine and propranolol for a while, and these seem to work for him I put him back on hydroxyzine in propranolol in December 2018, and asked him to follow-up in 2 months.  He is still working at [employer].  He notes that he is doing well overall, given current Covidcrisis.  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    He is not a smoker He is exercising at home most days since the gyms have been closed  We last got labs in 2017 He is also due for a tetanus and HIV screening  Patient Active Problem List   Diagnosis Date Noted  . Social anxiety disorder 03/17/2016  . Panic attacks 03/17/2016  . Facial flushing 03/17/2016  . Depression 03/17/2016    Past Medical History:  Diagnosis Date  . Irregular heart beat   . MRSA (methicillin resistant staph aureus) culture positive   . Nystagmus     Past Surgical History:  Procedure  Laterality Date  . EYE SURGERY  age 83    Social History   Tobacco Use  . Smoking status: Never Smoker  . Smokeless tobacco: Never Used  Substance Use Topics  . Alcohol use: No    Alcohol/week: 0.0 standard drinks  . Drug use: No    Family History  Problem Relation Age of Onset  . Thyroid disease Maternal Grandmother   . Heart Problems Maternal Grandfather   . Thyroid disease Maternal Grandfather   . Thyroid disease Paternal Grandmother   . Heart disease Paternal Grandfather   . Thyroid disease Paternal Grandfather     No Known Allergies  Medication list has been reviewed and updated.  Current Outpatient Medications on File Prior to Visit  Medication Sig Dispense Refill  . hydrOXYzine (ATARAX/VISTARIL) 25 MG tablet TAKE 1 TABLET (25 MG TOTAL) BY MOUTH DAILY AS NEEDED FOR ANXIETY OR ITCHING. 14 tablet 0  . propranolol (INDERAL) 10 MG tablet TAKE 1 TABLET (10 MG TOTAL) BY MOUTH 2 (TWO) TIMES DAILY. USE AS NEEDED FOR FOR SHAKINESS 60 tablet 3   No current facility-administered medications on file prior to visit.     Review of Systems:  As per HPI- otherwise negative.   Physical Examination: There were no vitals filed for this visit.  There were no vitals filed for this visit. There is no height or weight on file to calculate BMI. Ideal Body Weight:    Patient observed over WebEx.  He looks well, normal weight.  No cough or wheezing, no tachypnea observed  Assessment and Plan: Social anxiety disorder - Plan: hydrOXYzine (ATARAX/VISTARIL) 25 MG tablet, propranolol (INDERAL) 10 MG tablet  Panic attacks - Plan: hydrOXYzine (ATARAX/VISTARIL) 25 MG tablet, propranolol (INDERAL) 10 MG tablet  Virtual visit today due to COVID-19 pandemic.  Patient is doing well, he is stable on his current medications.  He is taking Atarax 25 mg once daily, and propranolol 10 twice daily.  I have refilled these medications, he will continue his current regimen I have asked him to see me in  about 6 months, so we can have a face-to-face visit, catch up on labs, immunizations. Patient has not set up a MyChart, so I will printed after visit summary and mail to him  Signed Abbe Amsterdam, MD

## 2019-03-24 NOTE — Patient Instructions (Signed)
It was great to talk with you today. As we discussed, pleasedto come and see me in the office in about 4-6 months.  We need to check up on routine labs and immunizations  You also might wish to set up your MyChart account, instructions are below

## 2019-10-27 ENCOUNTER — Other Ambulatory Visit: Payer: Self-pay

## 2019-10-27 ENCOUNTER — Encounter (HOSPITAL_COMMUNITY): Payer: Self-pay | Admitting: Emergency Medicine

## 2019-10-27 ENCOUNTER — Ambulatory Visit: Payer: Self-pay

## 2019-10-27 ENCOUNTER — Ambulatory Visit (INDEPENDENT_AMBULATORY_CARE_PROVIDER_SITE_OTHER): Payer: 59

## 2019-10-27 ENCOUNTER — Ambulatory Visit (HOSPITAL_COMMUNITY)
Admission: EM | Admit: 2019-10-27 | Discharge: 2019-10-27 | Disposition: A | Discharge status: Home / Self Care | Payer: 59 | Attending: Emergency Medicine | Admitting: Emergency Medicine

## 2019-10-27 DIAGNOSIS — R1013 Epigastric pain: Secondary | ICD-10-CM

## 2019-10-27 LAB — POCT URINALYSIS DIP (DEVICE)
Bilirubin Urine: NEGATIVE
Glucose, UA: NEGATIVE mg/dL
Hgb urine dipstick: NEGATIVE
Ketones, ur: NEGATIVE mg/dL
Leukocytes,Ua: NEGATIVE
Nitrite: NEGATIVE
Protein, ur: NEGATIVE mg/dL
Specific Gravity, Urine: 1.02 (ref 1.005–1.030)
Urobilinogen, UA: 0.2 mg/dL (ref 0.0–1.0)
pH: 7 (ref 5.0–8.0)

## 2019-10-27 MED ORDER — NAPROXEN 500 MG PO TABS: 500.0000 mg | ORAL_TABLET | Freq: Two times a day (BID) | ORAL | 0 refills | Status: DC

## 2019-10-27 NOTE — ED Provider Notes (Signed)
[name]    CSN: [id number] Arrival date & time: 10/27/19  1800      History   Chief Complaint Chief Complaint  Patient presents with  . Abdominal Pain    HPI Xavier Olson is a 23 y.o. male.   Pt states that he has upper abd pain near epigastic area after he eats for the past few weeks now. Denies any chest pain, no sob. Only hx of kidney stones in the past. Denies any urinary sx. Has not seen a pcp for this. Was taking vinegar pills before with no relief. Last BM 3 hrs ago normal no dark stools      Past Medical History:  Diagnosis Date  . Irregular heart beat   . MRSA (methicillin resistant staph aureus) culture positive   . Nystagmus     Patient Active Problem List   Diagnosis Date Noted  . Social anxiety disorder 03/17/2016  . Panic attacks 03/17/2016  . Facial flushing 03/17/2016  . Depression 03/17/2016    Past Surgical History:  Procedure Laterality Date  . EYE SURGERY  age 4       Home Medications    Prior to Admission medications   Medication Sig Start Date End Date Taking? Authorizing Provider  hydrOXYzine (ATARAX/VISTARIL) 25 MG tablet Take 1 tablet (25 mg total) by mouth daily as needed for anxiety or itching. 03/24/19  Yes Copland, Gay Filler, MD  propranolol (INDERAL) 10 MG tablet Take 1 tablet by mouth twice daily as needed for shakiness or anxiety 03/24/19  Yes Copland, Gay Filler, MD  hydrOXYzine (ATARAX/VISTARIL) 25 MG tablet TAKE 1 TABLET (25 MG TOTAL) BY MOUTH DAILY AS NEEDED FOR ANXIETY OR ITCHING. 03/28/19   Copland, Gay Filler, MD  naproxen (NAPROSYN) 500 MG tablet Take 1 tablet (500 mg total) by mouth 2 (two) times daily. 10/27/19   Marney Setting, NP    Family History Family History  Problem Relation Age of Onset  . Thyroid disease Maternal Grandmother   . Heart Problems Maternal Grandfather   . Thyroid disease Maternal Grandfather   . Thyroid disease Paternal Grandmother   . Heart disease Paternal Grandfather   .  Thyroid disease Paternal Grandfather     Social History Social History   Tobacco Use  . Smoking status: Never Smoker  . Smokeless tobacco: Never Used  Substance Use Topics  . Alcohol use: No    Alcohol/week: 0.0 standard drinks  . Drug use: No     Allergies   Patient has no known allergies.   Review of Systems Review of Systems  Constitutional: Positive for appetite change.  Respiratory: Negative.   Cardiovascular: Negative.   Gastrointestinal: Positive for abdominal pain and nausea.  Genitourinary: Negative.   Musculoskeletal: Negative.   Neurological: Negative.      Physical Exam Triage Vital Signs ED Triage Vitals  Enc Vitals Group     BP 10/27/19 1822 126/73     Pulse Rate 10/27/19 1822 61     Resp 10/27/19 1822 18     Temp 10/27/19 1822 98.2 F (36.8 C)     Temp Source 10/27/19 1822 Oral     SpO2 10/27/19 1822 100 %     Weight --      Height --      Head Circumference --      Peak Flow --      Pain Score 10/27/19 1819 5     Pain Loc --      Pain Edu? --  Excl. in GC? --    No data found.  Updated Vital Signs BP 126/73 (BP Location: Right Arm)   Pulse 61   Temp 98.2 F (36.8 C) (Oral)   Resp 18   SpO2 100%   Visual Acuity Right Eye Distance:   Left Eye Distance:   Bilateral Distance:    Right Eye Near:   Left Eye Near:    Bilateral Near:     Physical Exam Cardiovascular:     Rate and Rhythm: Normal rate.  Pulmonary:     Effort: Pulmonary effort is normal.  Abdominal:     General: Abdomen is flat. Bowel sounds are normal.     Palpations: Abdomen is soft.     Tenderness: There is abdominal tenderness in the epigastric area. Negative signs include Murphy's sign and McBurney's sign.  Skin:    General: Skin is warm.  Neurological:     General: No focal deficit present.     Mental Status: He is alert.      UC Treatments / Results  Labs (all labs ordered are listed, but only abnormal results are displayed) Labs Reviewed   POCT URINALYSIS DIP (DEVICE)    EKG   Radiology Dg Abdomen 1 View  Result Date: 10/27/2019 CLINICAL DATA:  Abdomen pain EXAM: ABDOMEN - 1 VIEW COMPARISON:  None. FINDINGS: The bowel gas pattern is normal. No radio-opaque calculi or other significant radiographic abnormality are seen. IMPRESSION: Negative. Electronically Signed   By: Jasmine Pang M.D.   On: 10/27/2019 19:27    Procedures Procedures (including critical care time)  Medications Ordered in UC Medications - No data to display  Initial Impression / Assessment and Plan / UC Course  I have reviewed the triage vital signs and the nursing notes.  Pertinent labs & imaging results that were available during my care of the patient were reviewed by me and considered in my medical decision making (see chart for details).    Discussed needing to have an ultrasound completed to r/o gallstones will need to see pcp for this.   Final Clinical Impressions(s) / UC Diagnoses   Final diagnoses:  Epigastric pain     Discharge Instructions     Will need to see pcp for outpt ultrasound  Monitor food intake  Take pain meds as needed  Avoid spicy foods  May need to see GI for further testing     ED Prescriptions    Medication Sig Dispense Auth. Provider   naproxen (NAPROSYN) 500 MG tablet Take 1 tablet (500 mg total) by mouth 2 (two) times daily. 30 tablet Coralyn Mark, NP     PDMP not reviewed this encounter.   Coralyn Mark, NP 10/29/19 [phone]

## 2019-10-27 NOTE — ED Triage Notes (Signed)
Complains of abdominal pain for a week.  History of the same, resolved on its own.  This time pain is sharper.  Pain is center epigastric area.  Patient notices it after eating and at night time.  denies nausea or vomiting, reports sweating with pain.  Reports normal bm today.

## 2019-10-27 NOTE — Telephone Encounter (Signed)
Patient called stating that he has had abdominal pain he rates at 5. He states that it is in the center of his abdomin just below his rib line. It seem to come on after he eats. He states that he has has pain before and it went away.  This time he feels it is worse lasting hours at night. He states the pain makes him sweat. He denies chest pain, SOB,and nausea. He has tried antiacids but no help. He states he has noticed no reflux. Care advice read to patient. He verbalized understanding. Patient will go to [facility] or ER for evaluation of symptoms. Office is closed for the day.  Reason for Disposition . [1] MILD-MODERATE pain AND [2] not relieved by antacids  Answer Assessment - Initial Assessment Questions 1. LOCATION: "Where does it hurt?"      Center below ribs upper 2. RADIATION: "Does the pain shoot anywhere else?" (e.g., chest, back)     no 3. ONSET: "When did the pain begin?" (e.g., minutes, hours or days ago)     1week ago 4. SUDDEN: "Gradual or sudden onset?"    sudden 5. PATTERN "Does the pain come and go, or is it constant?"    - If constant: "Is it getting better, staying the same, or worsening?"      (Note: Constant means the pain never goes away completely; most serious pain is constant and it progresses)     - If intermittent: "How long does it last?" "Do you have pain now?"     (Note: Intermittent means the pain goes away completely between bouts)     Only after eating 6. SEVERITY: "How bad is the pain?"  (e.g., Scale 1-10; mild, moderate, or severe)    - MILD (1-3): doesn't interfere with normal activities, abdomen soft and not tender to touch     - MODERATE (4-7): interferes with normal activities or awakens from sleep, tender to touch     - SEVERE (8-10): excruciating pain, doubled over, unable to do any normal activities       5 7. RECURRENT SYMPTOM: "Have you ever had this type of abdominal pain before?" If so, ask: "When was the last time?" and "What happened that time?"    A couple months ago just ignored the pain 8. AGGRAVATING FACTORS: "Does anything seem to cause this pain?" (e.g., foods, stress, alcohol)    food 9. CARDIAC SYMPTOMS: "Do you have any of the following symptoms: chest pain, difficulty breathing, sweating, nausea?"     sweats 10. OTHER SYMPTOMS: "Do you have any other symptoms?" (e.g., fever, vomiting, diarrhea)       no 11. PREGNANCY: "Is there any chance you are pregnant?" "When was your last menstrual period?"       N/A  Protocols used: ABDOMINAL PAIN - UPPER-A-AH

## 2019-10-27 NOTE — Discharge Instructions (Addendum)
Will need to see pcp for outpt ultrasound  Monitor food intake  Take pain meds as needed  Avoid spicy foods  May need to see GI for further testing

## 2019-10-28 ENCOUNTER — Telehealth: Payer: Self-pay

## 2019-10-28 NOTE — Telephone Encounter (Signed)
Called pt back- note from yesterday is not done so I am not sure of plan  He is feeling ok today- no pain He is able to eat  No vomiting  Offered to see him Monday but he has work and does not feel like he can miss Will see him Thursday at 8:40 am Until then low fat diet, if any severe pain or other distress seek immediate care

## 2019-10-28 NOTE — Telephone Encounter (Signed)
Copied from [place] [id number]. Topic: General - Inquiry >> Oct 28, 2019  8:21 AM Xavier Olson, NT wrote: Reason for CRM: Pt called in stating he went to urgent care yesterday and he stated he was told to call to have an ultrasound done with PCP ASAP, for his lower abdominal pain. They believe it is possible his gallbladder. Pt states he feels like the pain is in his abdominal area still and not there but would like an answer and help. Please advise and call back is [phone].

## 2019-10-31 ENCOUNTER — Ambulatory Visit: Payer: 59 | Admitting: Family Medicine

## 2019-11-01 NOTE — Progress Notes (Addendum)
[facility] [address], [address] [place], [place] [address] [phone]  Date:  11/03/2019   Name:  Xavier Olson   DOB:  May 23, 1996   MRN:  [id number]  PCP:  Pearline Cables, MD    Chief Complaint: Follow-up (epigastric pain)   History of Present Illness:  Xavier Olson is a 23 y.o. very pleasant male patient who presents with the following:  Here today to follow-up from recent urgent care visit He was seen at [facility] urgent care on 11/5 with epigastric pain after eating-I have reviewed this note He notes that he had missed lunch, and then pain started after he ate dinner.  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 Pylori Flu shot- done already  Tetanus booster appears to be due  No chest pain or shortness of breath  Patient Active Problem List   Diagnosis Date Noted  . Social anxiety disorder 03/17/2016  . Panic attacks 03/17/2016  . Facial flushing 03/17/2016  . Depression 03/17/2016    Past Medical History:  Diagnosis Date  . Irregular heart beat   . MRSA (methicillin resistant staph aureus) culture positive   . Nystagmus     Past Surgical History:  Procedure Laterality Date  . EYE SURGERY  age 75    Social History   Tobacco Use  . Smoking status: Never Smoker   . Smokeless tobacco: Never Used  Substance Use Topics  . Alcohol use: No    Alcohol/week: 0.0 standard drinks  . Drug use: No    Family History  Problem Relation Age of Onset  . Thyroid disease Maternal Grandmother   . Heart Problems Maternal Grandfather   . Thyroid disease Maternal Grandfather   . Thyroid disease Paternal Grandmother   . Heart disease Paternal Grandfather   . Thyroid disease Paternal Grandfather     No Known Allergies  Medication list has been reviewed and updated.  Current Outpatient Medications on File Prior to Visit  Medication Sig Dispense Refill  . hydrOXYzine (ATARAX/VISTARIL) 25 MG tablet TAKE 1 TABLET (25 MG TOTAL) BY MOUTH DAILY AS NEEDED FOR ANXIETY OR ITCHING. 90 tablet 1  . propranolol (INDERAL) 10 MG tablet Take 1 tablet by mouth twice daily as needed for shakiness or anxiety 180 tablet 3   No current facility-administered medications on file prior to visit.     Review of Systems:  As per HPI- otherwise negative. No fever or chills  Physical Examination: Vitals:   11/03/19 0837  BP: 130/82  Pulse: (!) 57  Resp: 16  Temp: (!) 97.4 F (36.3 C)  SpO2: 98%   Vitals:   11/03/19 0837  Weight: 235 lb (106.6  kg)  Height: 5\' 9"  (1.753 m)   Body mass index is 34.7 kg/m. Ideal Body Weight: Weight in (lb) to have BMI = 25: 168.9  GEN: WDWN, NAD, Non-toxic, A & O x 3, overweight, looks well HEENT: Atraumatic, Normocephalic. Neck supple. No masses, No LAD.  TM within normal limits Ears and Nose: No external deformity. CV: RRR, No M/G/R. No JVD. No thrill. No extra heart sounds. PULM: CTA B, no wheezes, crackles, rhonchi. No retractions. No resp. distress. No accessory muscle use. ABD: S, NT, ND, +BS. No rebound. No HSM.  Time his belly is completely benign.  Negative Murphy sign EXTR: No c/c/e NEURO Normal gait.  PSYCH: Normally interactive. Conversant. Not depressed or anxious appearing.  Calm demeanor.   Wt Readings from Last 3  Encounters:  11/03/19 235 lb (106.6 kg)  12/09/17 256 lb (116.1 kg)  06/12/16 226 lb 9.6 oz (102.8 kg) (98 %, Z= 2.01)*   * Growth percentiles are based on CDC (Boys, 2-20 Years) data.    Assessment and Plan: Epigastric pain - Plan: CBC, Comprehensive metabolic panel, H. pylori breath test, pantoprazole (PROTONIX) 40 MG tablet, sucralfate (CARAFATE) 1 g tablet, Lipase  Immunization due - Plan: Td vaccine  Here today with concern of epigastric pain.  He was seen in urgent care 1 week ago for this issue, did not have any diagnostics at that time.  Today we will check labs as above, H. pylori breath test.  We will start him on Carafate and Protonix for likely gastritis/GERD.  Encouraged him to avoid eating a large dinner, avoid laying down shortly after eating  We also discussed possible gallbladder disease, and I encouraged a right upper quadrant ultrasound.  He declines at this time due to expense, but understands he may need to proceed to this test if symptoms do not resolve  He will seek care right away if getting worse  Signed Lamar Blinks, MD   Received his labs as below-call patient as his MyChart is not set up I asked him to let me know if symptoms were not much better in about 1 week, sooner if doing worse or not improving Results for orders placed or performed in visit on 11/03/19  CBC  Result Value Ref Range   WBC 7.0 4.0 - 10.5 K/uL   RBC 4.83 4.22 - 5.81 Mil/uL   Platelets 210.0 150.0 - 400.0 K/uL   Hemoglobin 15.0 13.0 - 17.0 g/dL   HCT 44.1 39.0 - 52.0 %   MCV 91.3 78.0 - 100.0 fl   MCHC 34.0 30.0 - 36.0 g/dL   RDW 12.8 11.5 - 15.5 %  Comprehensive metabolic panel  Result Value Ref Range   Sodium 141 135 - 145 mEq/L   Potassium 4.2 3.5 - 5.1 mEq/L   Chloride 104 96 - 112 mEq/L   CO2 31 19 - 32 mEq/L   Glucose, Bld 100 (H) 70 - 99 mg/dL   BUN 11 6 - 23 mg/dL   Creatinine, Ser 0.97 0.40 - 1.50 mg/dL   Total Bilirubin 0.5 0.2 - 1.2 mg/dL   Alkaline  Phosphatase 52 39 - 117 U/L   AST 13 0 - 37 U/L   ALT 11 0 - 53 U/L   Total Protein 6.3 6.0 - 8.3 g/dL   Albumin 4.2 3.5 - 5.2 g/dL   GFR 96.00 >60.00 mL/min   Calcium 9.2 8.4 - 10.5 mg/dL  Lipase  Result Value Ref Range   Lipase 35.0 11.0 - 59.0 U/L  Received H pylori- negative Letter to pt with results

## 2019-11-02 ENCOUNTER — Other Ambulatory Visit: Payer: Self-pay

## 2019-11-03 ENCOUNTER — Ambulatory Visit (INDEPENDENT_AMBULATORY_CARE_PROVIDER_SITE_OTHER): Payer: 59 | Admitting: Family Medicine

## 2019-11-03 ENCOUNTER — Other Ambulatory Visit: Payer: Self-pay

## 2019-11-03 ENCOUNTER — Encounter: Payer: Self-pay | Admitting: Family Medicine

## 2019-11-03 VITALS — BP 130/82 | HR 57 | Temp 97.4°F | Resp 16 | Ht 69.0 in | Wt 235.0 lb

## 2019-11-03 DIAGNOSIS — R1013 Epigastric pain: Secondary | ICD-10-CM | POA: Diagnosis not present

## 2019-11-03 DIAGNOSIS — Z23 Encounter for immunization: Secondary | ICD-10-CM | POA: Diagnosis not present

## 2019-11-03 LAB — CBC
HCT: 44.1 % (ref 39.0–52.0)
Hemoglobin: 15 g/dL (ref 13.0–17.0)
MCHC: 34 g/dL (ref 30.0–36.0)
MCV: 91.3 fl (ref 78.0–100.0)
Platelets: 210 10*3/uL (ref 150.0–400.0)
RBC: 4.83 Mil/uL (ref 4.22–5.81)
RDW: 12.8 % (ref 11.5–15.5)
WBC: 7 10*3/uL (ref 4.0–10.5)

## 2019-11-03 LAB — COMPREHENSIVE METABOLIC PANEL
ALT: 11 U/L (ref 0–53)
AST: 13 U/L (ref 0–37)
Albumin: 4.2 g/dL (ref 3.5–5.2)
Alkaline Phosphatase: 52 U/L (ref 39–117)
BUN: 11 mg/dL (ref 6–23)
CO2: 31 mEq/L (ref 19–32)
Calcium: 9.2 mg/dL (ref 8.4–10.5)
Chloride: 104 mEq/L (ref 96–112)
Creatinine, Ser: 0.97 mg/dL (ref 0.40–1.50)
GFR: 96 mL/min (ref >60.00)
Glucose, Bld: 100 mg/dL — ABNORMAL HIGH (ref 70–99)
Potassium: 4.2 mEq/L (ref 3.5–5.1)
Sodium: 141 mEq/L (ref 135–145)
Total Bilirubin: 0.5 mg/dL (ref 0.2–1.2)
Total Protein: 6.3 g/dL (ref 6.0–8.3)

## 2019-11-03 LAB — LIPASE: Lipase: 35 U/L (ref 11.0–59.0)

## 2019-11-03 MED ORDER — PANTOPRAZOLE SODIUM 40 MG PO TBEC: 40.0000 mg | DELAYED_RELEASE_TABLET | Freq: Every day | ORAL | 3 refills | Status: DC

## 2019-11-03 MED ORDER — SUCRALFATE 1 G PO TABS: 1.0000 g | ORAL_TABLET | Freq: Three times a day (TID) | ORAL | 0 refills | Status: DC

## 2019-11-03 NOTE — Patient Instructions (Signed)
Good to see you today You got a tetanus booster today I will be in touch with your labs asap We are going to treat you for gastritis with protonix once a day, and carafate with meals and at bedtime for 7- 10 days  Avoid foods that seem to make you worse, and avoid eating a large meal late at night  If you are getting worse or not doing ok please go to the ER for evaluation If your symptoms persist we will need to do an ultrasound to check on your gallbladder

## 2019-11-04 LAB — H. PYLORI BREATH TEST: H. pylori Breath Test: NOT DETECTED

## 2019-12-06 ENCOUNTER — Other Ambulatory Visit: Payer: Self-pay | Admitting: Family Medicine

## 2019-12-06 DIAGNOSIS — F4010 Social phobia, unspecified: Secondary | ICD-10-CM

## 2019-12-06 DIAGNOSIS — F410 Panic disorder [episodic paroxysmal anxiety] without agoraphobia: Secondary | ICD-10-CM

## 2020-01-26 ENCOUNTER — Other Ambulatory Visit: Payer: Self-pay | Admitting: Family Medicine

## 2020-01-26 DIAGNOSIS — R1013 Epigastric pain: Secondary | ICD-10-CM

## 2020-03-09 ENCOUNTER — Other Ambulatory Visit: Payer: Self-pay | Admitting: Family Medicine

## 2020-03-09 DIAGNOSIS — F410 Panic disorder [episodic paroxysmal anxiety] without agoraphobia: Secondary | ICD-10-CM

## 2020-03-09 DIAGNOSIS — F4010 Social phobia, unspecified: Secondary | ICD-10-CM

## 2020-06-04 ENCOUNTER — Other Ambulatory Visit: Payer: Self-pay | Admitting: Family Medicine

## 2020-06-04 DIAGNOSIS — F4010 Social phobia, unspecified: Secondary | ICD-10-CM

## 2020-06-04 DIAGNOSIS — F410 Panic disorder [episodic paroxysmal anxiety] without agoraphobia: Secondary | ICD-10-CM

## 2020-06-13 ENCOUNTER — Other Ambulatory Visit: Payer: Self-pay

## 2020-06-13 ENCOUNTER — Ambulatory Visit (INDEPENDENT_AMBULATORY_CARE_PROVIDER_SITE_OTHER): Payer: Self-pay | Admitting: Medical

## 2020-06-13 ENCOUNTER — Encounter: Payer: Self-pay | Admitting: Medical

## 2020-06-13 VITALS — BP 123/66 | HR 63 | Resp 18 | Ht 70.0 in | Wt 236.8 lb

## 2020-06-13 DIAGNOSIS — B86 Scabies: Secondary | ICD-10-CM

## 2020-06-13 MED ORDER — PERMETHRIN 5 % EX CREA: TOPICAL_CREAM | CUTANEOUS | 1 refills | Status: DC

## 2020-06-13 NOTE — Progress Notes (Addendum)
Subjective:    Patient ID: Xavier Olson, male    DOB: 1996-09-05, 24 y.o.   MRN: [id number]  HPI  Patient has small bumps to top of his feet, ankles, hands and forearm.  Left side worse than the right side.  Areas have been itching moderate to severe.  He went to a party at a friend's house past Thursday.  Various friends at that house were diagnosed with scabies.   Review of Systems  Constitutional: Negative for appetite change, diaphoresis and fatigue.  Respiratory: Negative for cough, shortness of breath and wheezing.   Cardiovascular: Negative for chest pain and palpitations.  Gastrointestinal: Negative for abdominal pain.  Skin: Positive for rash.  Neurological: Negative for dizziness.  Hematological: Negative for adenopathy.   Past Medical History:  Diagnosis Date   Irregular heart beat    MRSA (methicillin resistant staph aureus) culture positive    Nystagmus      Social History   Socioeconomic History   Marital status: Single    Spouse name: Not on file   Number of children: 0   Years of education: HS   Highest education level: Not on file  Occupational History    Employer: OTHER  Tobacco Use   Smoking status: Never Smoker   Smokeless tobacco: Never Used  Substance and Sexual Activity   Alcohol use: No    Alcohol/week: 0.0 standard drinks   Drug use: No   Sexual activity: Not on file  Other Topics Concern   Not on file  Social History Narrative   Patient lives at home with family.   Caffeine Use: 1 cup daily   Social Determinants of Health   Financial Resource Strain:    Difficulty of Paying Living Expenses:   Food Insecurity:    Worried About Programme researcher, broadcasting/film/video in the Last Year:    Barista in the Last Year:   Transportation Needs:    Freight forwarder (Medical):    Lack of Transportation (Non-Medical):   Physical Activity:    Days of Exercise per Week:    Minutes of Exercise per Session:   Stress:     Feeling of Stress :   Social Connections:    Frequency of Communication with Friends and Family:    Frequency of Social Gatherings with Friends and Family:    Attends Religious Services:    Active Member of Clubs or Organizations:    Attends Engineer, structural:    Marital Status:   Intimate Partner Violence:    Fear of Current or Ex-Partner:    Emotionally Abused:    Physically Abused:    Sexually Abused:     Past Surgical History:  Procedure Laterality Date   EYE SURGERY  age 37    Family History  Problem Relation Age of Onset   Thyroid disease Maternal Grandmother    Heart Problems Maternal Grandfather    Thyroid disease Maternal Grandfather    Thyroid disease Paternal Grandmother    Heart disease Paternal Grandfather    Thyroid disease Paternal Grandfather     No Known Allergies  Current Outpatient Medications on File Prior to Visit  Medication Sig Dispense Refill   hydrOXYzine (ATARAX/VISTARIL) 25 MG tablet TAKE 1 TABLET (25 MG TOTAL) BY MOUTH DAILY AS NEEDED FOR ANXIETY OR ITCHING. 90 tablet 0   pantoprazole (PROTONIX) 40 MG tablet TAKE 1 TABLET BY MOUTH EVERY DAY (Patient not taking: Reported on 06/13/2020) 90 tablet 1   propranolol (INDERAL)  10 MG tablet TAKE 1 TABLET BY MOUTH TWICE DAILY AS NEEDED FOR SHAKINESS OR ANXIETY 180 tablet 3   sucralfate (CARAFATE) 1 g tablet Take 1 tablet (1 g total) by mouth 4 (four) times daily -  with meals and at bedtime. (Patient not taking: Reported on 06/13/2020) 40 tablet 0   No current facility-administered medications on file prior to visit.    BP 123/66    Pulse 63    Resp 18    Ht 5\' 10"  (1.778 m)    Wt 236 lb 12.8 oz (107.4 kg)    SpO2 98%    BMI 33.98 kg/m       Objective:   Physical Exam   General- No acute distress. Pleasant patient. Neck- Full range of motion, no jvd Lungs- Clear, even and unlabored. Heart- regular rate and rhythm. Neurologic- CNII- XII grossly  intact. Derm-patient has scattered small bumps almost scab appearing on both feet, ankles wrist and proximal forearm.        Assessment & Plan:  You do appear to have probable scabies based on history given as well as physical exam.  Will treat with permethrin.  Repeat use in 10 days if needed.  If rash worsens, changes or persist despite 2 treatments then might need to refer you to specialist  Follow-up 10 days or as needed.  Mackie Pai, PA-C   Time spent with patient today was  20 minutes  discussing diagnosis, treatment and documentation.

## 2020-06-13 NOTE — Patient Instructions (Addendum)
You do appear to have probable scabies based on history given as well as physical exam.  Will treat with permethrin.  Repeat use in 10 days if needed.  If rash worsens, changes or persist despite 2 treatments then might need to refer you to specialist  Follow-up 10 days or as needed.   Scabies, Adult  Scabies is a skin condition that happens when very small insects get under the skin (infestation). This causes a rash and severe itchiness. Scabies can spread from person to person (is contagious). If you get scabies, it is common for others in your household to get scabies too. With proper treatment, symptoms usually go away in 2-4 weeks. Scabies usually does not cause lasting problems. What are the causes? This condition is caused by tiny mites (Sarcoptes scabiei, or human itch mites) that can only be seen with a microscope. The mites get into the top layer of skin and lay eggs. Scabies can spread from person to person through:  Close contact with a person who has scabies.  Sharing or having contact with infested items, such as towels, bedding, or clothing. What increases the risk? The following factors may make you more likely to develop this condition:  Living in a nursing home or other extended care facility.  Having sexual contact with a partner who has scabies.  Caring for others who are at increased risk for scabies. What are the signs or symptoms? Symptoms of this condition include:  Severe itchiness. This is often worse at night.  A rash that includes tiny red bumps or blisters. The rash commonly occurs on the hands, wrists, elbows, armpits, chest, waist, groin, or buttocks. The bumps may form a line (burrow) in some areas.  Skin irritation. This can include scaly patches or sores. How is this diagnosed? This condition may be diagnosed based on:  A physical exam of the skin.  A skin test. Your health care provider may take a sample of your affected skin (skin scraping)  and have it examined under a microscope for signs of mites. How is this treated? This condition may be treated with:  Medicated cream or lotion that kills the mites. This is spread on the entire body and left on for several hours. Usually, one treatment with medicated cream or lotion is enough to kill all the mites. In severe cases, the treatment may need to be repeated.  Medicated cream that relieves itching.  Medicines taken by mouth (orally) that: ? Relieve itching. ? Reduce the swelling and redness. ? Kill the mites. This treatment may be done in severe cases. Follow these instructions at home: Medicines   Take or apply over-the-counter and prescription medicines as told by your health care provider.  Apply medicated cream or lotion as told by your health care provider.  Do not wash off the medicated cream or lotion until the necessary amount of time has passed. Skin care   Avoid scratching the affected areas of your skin.  Keep your fingernails closely trimmed to reduce injury from scratching.  Take cool baths or apply cool washcloths to your skin to help reduce itching. General instructions  Clean all items that you recently had contact with, including bedding, clothing, and furniture. Do this on the same day that you start treatment. ? Dry clean items, or use hot water to wash items. Dry items on the hot dry cycle. ? Place items that cannot be washed into closed, airtight plastic bags for at least 3 days. The mites cannot  live for more than 3 days away from human skin. ? Vacuum furniture and mattresses that you use.  Make sure that other people who may have been infested are examined by a health care provider. These include members of your household and anyone who may have had contact with infested items.  Keep all follow-up visits as told by your health care provider. This is important. Contact a health care provider if:  You have itching that does not go away after 4  weeks of treatment.  You continue to develop new bumps or burrows.  You have redness, swelling, or pain in your rash area after treatment.  You have fluid, blood, or pus coming from your rash. Summary  Scabies is a skin condition that causes a rash and severe itchiness.  This condition is caused by tiny mites that get into the top layer of the skin and lay eggs.  Scabies can spread from person to person.  Follow treatments as recommended by your health care provider.  Clean all items that you recently had contact with. This information is not intended to replace advice given to you by your health care provider. Make sure you discuss any questions you have with your health care provider. Document Revised: 10/13/2018 Document Reviewed: 10/13/2018 Elsevier Patient Education  2020 [employer].

## 2020-09-02 ENCOUNTER — Other Ambulatory Visit: Payer: Self-pay | Admitting: Family Medicine

## 2020-09-02 DIAGNOSIS — F4010 Social phobia, unspecified: Secondary | ICD-10-CM

## 2020-09-02 DIAGNOSIS — F410 Panic disorder [episodic paroxysmal anxiety] without agoraphobia: Secondary | ICD-10-CM

## 2020-12-01 ENCOUNTER — Other Ambulatory Visit: Payer: Self-pay | Admitting: Family Medicine

## 2020-12-01 DIAGNOSIS — F4010 Social phobia, unspecified: Secondary | ICD-10-CM

## 2020-12-01 DIAGNOSIS — F410 Panic disorder [episodic paroxysmal anxiety] without agoraphobia: Secondary | ICD-10-CM

## 2021-01-04 ENCOUNTER — Other Ambulatory Visit: Payer: Self-pay | Admitting: Family Medicine

## 2021-01-04 DIAGNOSIS — F4010 Social phobia, unspecified: Secondary | ICD-10-CM

## 2021-01-04 DIAGNOSIS — F410 Panic disorder [episodic paroxysmal anxiety] without agoraphobia: Secondary | ICD-10-CM

## 2021-01-05 ENCOUNTER — Other Ambulatory Visit: Payer: Self-pay | Admitting: Family Medicine

## 2021-01-05 DIAGNOSIS — F410 Panic disorder [episodic paroxysmal anxiety] without agoraphobia: Secondary | ICD-10-CM

## 2021-01-05 DIAGNOSIS — F4010 Social phobia, unspecified: Secondary | ICD-10-CM

## 2021-01-09 ENCOUNTER — Telehealth: Payer: Self-pay | Admitting: Family Medicine

## 2021-01-09 NOTE — Telephone Encounter (Signed)
Please call him- if he thinks he has strep he needs to be seen.  As far as OTC medication I would recommend a chloraseptic throat spray to use for pain

## 2021-01-09 NOTE — Telephone Encounter (Signed)
Offered an appointment, patient declined appointment. Patient states looking for recommendation.

## 2021-01-09 NOTE — Telephone Encounter (Signed)
Patient needs appointment-VV

## 2021-01-09 NOTE — Telephone Encounter (Signed)
Patient called requesting a recommendation for an over the counter drug to help with his sore throat. Patient believes he may have strep.   Please advise

## 2021-01-10 NOTE — Telephone Encounter (Signed)
Attempted to call patient. VM not set up.

## 2021-01-24 ENCOUNTER — Other Ambulatory Visit: Payer: Self-pay | Admitting: Family Medicine

## 2021-01-24 DIAGNOSIS — F4010 Social phobia, unspecified: Secondary | ICD-10-CM

## 2021-01-24 DIAGNOSIS — F410 Panic disorder [episodic paroxysmal anxiety] without agoraphobia: Secondary | ICD-10-CM

## 2021-02-06 ENCOUNTER — Telehealth: Payer: Self-pay | Admitting: Family Medicine

## 2021-02-06 DIAGNOSIS — F410 Panic disorder [episodic paroxysmal anxiety] without agoraphobia: Secondary | ICD-10-CM

## 2021-02-06 DIAGNOSIS — F4010 Social phobia, unspecified: Secondary | ICD-10-CM

## 2021-02-06 DIAGNOSIS — R1013 Epigastric pain: Secondary | ICD-10-CM

## 2021-02-06 MED ORDER — PANTOPRAZOLE SODIUM 40 MG PO TBEC: 40.0000 mg | DELAYED_RELEASE_TABLET | Freq: Every day | ORAL | 0 refills | Status: DC

## 2021-02-06 MED ORDER — HYDROXYZINE HCL 25 MG PO TABS: 25.0000 mg | ORAL_TABLET | Freq: Every day | ORAL | 0 refills | Status: DC | PRN

## 2021-02-06 NOTE — Telephone Encounter (Signed)
Patient is requesting enough medication until Monday's appointment 02/11/2021 @0920   Medication:hydrOXYzine (ATARAX/VISTARIL) 25 MG tablet  propranolol (INDERAL) 10 MG tablet [[id number]  Has the patient contacted their pharmacy? No. (If no, request that the patient contact the pharmacy for the refill.) (If yes, when and what did the pharmacy advise?)  Preferred Pharmacy (with phone number or street name): [id number] [[id number], [place] - [address]  [address], [place]  Phone:  [phone] Fax:  [phone]  DEA #:  [phone]  Agent: Please be advised that RX refills may take up to 3 business days. We ask that you follow-up with your pharmacy.

## 2021-02-06 NOTE — Telephone Encounter (Signed)
Medication refilled until patient's appointment. 

## 2021-02-08 NOTE — Progress Notes (Addendum)
[facility] [address], [address] [place], [place] [address] [phone]  Date:  02/11/2021   Name:  Xavier Olson   DOB:  1996/02/09   MRN:  [id number]  PCP:  Pearline Cables, MD    Chief Complaint: Medication Refill (No concerns/)   History of Present Illness:  Xavier Olson is a 25 y.o. very pleasant male patient who presents with the following:  Here today for a medication follow-up Last seen by myself 10/2019 History of social anxiety and panic attacks, depression  covid series-not done, encouraged to have this as soon as possible Flu vaccine-encouraged flu vaccine Most recent labs 2020- he is not fasting this am  Defer hepatitis C and HIV screening as patient is self-pay  He has used the propranolol 20 am for shakiness and anxiety  BP Readings from Last 3 Encounters:  02/11/21 (!) 142/85  06/13/20 123/66  11/03/19 130/82   Carmon states is doing well, he has a new job. 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  He denies any risk of self harm or suicidal ideation He has been pretty busy with work He tries to get outdoors and run for exercise when he can   He took Paxil several years ago, not currently on any other antidepressant  Patient Active Problem List   Diagnosis Date Noted  . Social anxiety disorder 03/17/2016  . Panic attacks 03/17/2016  . Facial flushing 03/17/2016  . Depression 03/17/2016    Past Medical History:  Diagnosis Date  . Irregular heart beat   . MRSA (methicillin resistant staph aureus) culture positive   . Nystagmus     Past Surgical History:   Procedure Laterality Date  . EYE SURGERY  age 70    Social History   Tobacco Use  . Smoking status: Current Some Day Smoker    Types: Cigars  . Smokeless tobacco: Never Used  . Tobacco comment: 2 cigars per month  Substance Use Topics  . Alcohol use: No    Alcohol/week: 0.0 standard drinks  . Drug use: No    Family History  Problem Relation Age of Onset  . Thyroid disease Maternal Grandmother   . Heart Problems Maternal Grandfather   . Thyroid disease Maternal Grandfather   . Thyroid disease Paternal Grandmother   . Heart disease Paternal Grandfather   . Thyroid disease Paternal Grandfather     No Known Allergies  Medication list has been reviewed and updated.  No current outpatient medications on file prior to visit.   No current facility-administered medications on file prior to visit.    Review of Systems:  As per HPI- otherwise negative.   Physical Examination: Vitals:   02/11/21 0928 02/11/21 0948  BP: (!) 158/80 (!) 142/85  Pulse: (!) 101 70  Resp: 18   SpO2: 98%    Vitals:   02/11/21 0928  Weight: 247 lb (112 kg)  Height: 5' 11.75" (1.822 m)   Body mass index is 33.73 kg/m. Ideal Body Weight: Weight in (lb) to have BMI = 25: 182.7  GEN: no acute distress.  Obese, appears physically well  HEENT: Atraumatic, Normocephalic.  Ears and Nose: No external deformity. CV: RRR, No M/G/R. No JVD. No thrill. No extra heart sounds. PULM: CTA B, no wheezes, crackles, rhonchi. No retractions. No resp. distress. No accessory muscle use. ABD: S, NT, ND, +BS. No rebound. No HSM. EXTR: No c/c/e PSYCH: Normally interactive. Conversant.    Assessment and Plan: Social anxiety disorder - Plan: propranolol (INDERAL) 10 MG tablet, hydrOXYzine (ATARAX/VISTARIL) 25 MG tablet  Panic attacks - Plan: propranolol (INDERAL) 10 MG tablet, hydrOXYzine (ATARAX/VISTARIL) 25 MG tablet  Screening, deficiency anemia, iron - Plan: CBC  Screening for diabetes mellitus - Plan:  Comprehensive metabolic panel, Hemoglobin A1c  Screening for hyperlipidemia - Plan: Lipid panel  Mild episode of recurrent major depressive disorder (HCC) - Plan: FLUoxetine (PROZAC) 20 MG tablet  Patient today for a follow-up visit.  Unfortunately, he is suffering from anxiety and depression.  He denies any risk of self-harm.  We decided to start him on fluoxetine 20 mg daily.  He has used Atarax in the past, I encouraged him to use this for sleep as needed.  He also takes propanolol to help with anxiety and shakiness, currently taking 20 mg in the morning.  I advised him this may be more effective if he takes it in 2 separate doses of 10 mg, perhaps 1 in the morning 1 at noon; he will try this I encouraged him to work on exercise and general self-care  Encouraged COVID-19 and flu vaccines  Noted borderline blood pressure.  Encouraged exercise and sodium reduction  We went over how to sign for MyChart, I asked him to please update me in 2 to 3 weeks-sooner if not doing okay This visit occurred during the SARS-CoV-2 public health emergency.  Safety protocols were in place, including screening questions prior to the visit, additional usage of staff PPE, and extensive cleaning of exam room while observing appropriate contact time as indicated for disinfecting solutions.    Signed Abbe Amsterdam, MD  Received his labs as below, letter to patient  Results for orders placed or performed in visit on 02/11/21  CBC  Result Value Ref Range   WBC 6.7 4.0 - 10.5 K/uL   RBC 5.27 4.22 - 5.81 Mil/uL   Platelets 218.0 150.0 - 400.0 K/uL   Hemoglobin 15.7 13.0 - 17.0 g/dL   HCT 03.5 00.9 - 38.1 %   MCV 90.0 78.0 - 100.0 fl   MCHC 33.2 30.0 - 36.0 g/dL   RDW 82.9 93.7 - 16.9 %  Comprehensive metabolic panel  Result Value Ref Range   Sodium 139 135 - 145 mEq/L   Potassium 4.6 3.5 - 5.1 mEq/L   Chloride 102 96 - 112 mEq/L   CO2 30 19 - 32 mEq/L   Glucose, Bld 98 70 - 99 mg/dL   BUN 15 6 - 23  mg/dL   Creatinine, Ser 6.78 0.40 - 1.50 mg/dL   Total Bilirubin 0.5 0.2 - 1.2 mg/dL   Alkaline Phosphatase 60 39 - 117 U/L   AST 18 0 - 37 U/L   ALT 24 0 - 53 U/L   Total Protein 7.4 6.0 - 8.3 g/dL   Albumin 4.6 3.5 - 5.2 g/dL   GFR 938.10 >17.51 mL/min   Calcium 9.9 8.4 - 10.5 mg/dL  Hemoglobin W2H  Result Value Ref Range   Hgb A1c MFr Bld 5.5 4.6 - 6.5 %  Lipid panel  Result Value Ref Range   Cholesterol 172 0 - 200 mg/dL  Triglycerides 108.0 0.0 - 149.0 mg/dL   HDL 65.46 >50.35 mg/dL   VLDL 46.5 0.0 - 68.1 mg/dL   LDL Cholesterol 97 0 - 99 mg/dL   Total CHOL/HDL Ratio 3    NonHDL 118.29

## 2021-02-08 NOTE — Patient Instructions (Addendum)
Good to see you again today!  I will be in touch with your labs  I am sorry that you have been under so much stress recently I refilled your propranolol to use as needed for shakiness Atarax at bedtime should help you sleep Try the fluoxetine for depression and anxiety- please take this once a day, let me know how this is working for you in the next 2-3 weeks.  We can go up on the dose if needed Please do work on exercise- this will help with mood and BP control  If you are in any danger of self- harm please seek help right away!    Please get your covid and flu vaccines- missing work and being in the hospital will not help your situation  Take care   Health Maintenance, Male Adopting a healthy lifestyle and getting preventive care are important in promoting health and wellness. Ask your health care provider about:  The right schedule for you to have regular tests and exams.  Things you can do on your own to prevent diseases and keep yourself healthy. What should I know about diet, weight, and exercise? Eat a healthy diet  Eat a diet that includes plenty of vegetables, fruits, low-fat dairy products, and lean protein.  Do not eat a lot of foods that are high in solid fats, added sugars, or sodium.   Maintain a healthy weight Body mass index (BMI) is a measurement that can be used to identify possible weight problems. It estimates body fat based on height and weight. Your health care provider can help determine your BMI and help you achieve or maintain a healthy weight. Get regular exercise Get regular exercise. This is one of the most important things you can do for your health. Most adults should:  Exercise for at least 150 minutes each week. The exercise should increase your heart rate and make you sweat (moderate-intensity exercise).  Do strengthening exercises at least twice a week. This is in addition to the moderate-intensity exercise.  Spend less time sitting. Even light  physical activity can be beneficial. Watch cholesterol and blood lipids Have your blood tested for lipids and cholesterol at 25 years of age, then have this test every 5 years. You may need to have your cholesterol levels checked more often if:  Your lipid or cholesterol levels are high.  You are older than 25 years of age.  You are at high risk for heart disease. What should I know about cancer screening? Many types of cancers can be detected early and may often be prevented. Depending on your health history and family history, you may need to have cancer screening at various ages. This may include screening for:  Colorectal cancer.  Prostate cancer.  Skin cancer.  Lung cancer. What should I know about heart disease, diabetes, and high blood pressure? Blood pressure and heart disease  High blood pressure causes heart disease and increases the risk of stroke. This is more likely to develop in people who have high blood pressure readings, are of African descent, or are overweight.  Talk with your health care provider about your target blood pressure readings.  Have your blood pressure checked: ? Every 3-5 years if you are 86-18 years of age. ? Every year if you are 49 years old or older.  If you are between the ages of 92 and 67 and are a current or former smoker, ask your health care provider if you should have a one-time screening for  abdominal aortic aneurysm (AAA). Diabetes Have regular diabetes screenings. This checks your fasting blood sugar level. Have the screening done:  Once every three years after age 16 if you are at a normal weight and have a low risk for diabetes.  More often and at a younger age if you are overweight or have a high risk for diabetes. What should I know about preventing infection? Hepatitis B If you have a higher risk for hepatitis B, you should be screened for this virus. Talk with your health care provider to find out if you are at risk for  hepatitis B infection. Hepatitis C Blood testing is recommended for:  Everyone born from [birth year] through 1965.  Anyone with known risk factors for hepatitis C. Sexually transmitted infections (STIs)  You should be screened each year for STIs, including gonorrhea and chlamydia, if: ? You are sexually active and are younger than 24 years of age. ? You are older than 25 years of age and your health care provider tells you that you are at risk for this type of infection. ? Your sexual activity has changed since you were last screened, and you are at increased risk for chlamydia or gonorrhea. Ask your health care provider if you are at risk.  Ask your health care provider about whether you are at high risk for HIV. Your health care provider may recommend a prescription medicine to help prevent HIV infection. If you choose to take medicine to prevent HIV, you should first get tested for HIV. You should then be tested every 3 months for as long as you are taking the medicine. Follow these instructions at home: Lifestyle  Do not use any products that contain nicotine or tobacco, such as cigarettes, e-cigarettes, and chewing tobacco. If you need help quitting, ask your health care provider.  Do not use street drugs.  Do not share needles.  Ask your health care provider for help if you need support or information about quitting drugs. Alcohol use  Do not drink alcohol if your health care provider tells you not to drink.  If you drink alcohol: ? Limit how much you have to 0-2 drinks a day. ? Be aware of how much alcohol is in your drink. In the [place] one drink equals one 12 oz bottle of beer (355 mL), one 5 oz glass of wine (148 mL), or one 1 oz glass of hard liquor (44 mL). General instructions  Schedule regular health, dental, and eye exams.  Stay current with your vaccines.  Tell your health care provider if: ? You often feel depressed. ? You have ever been abused or do not feel safe at  home. Summary  Adopting a healthy lifestyle and getting preventive care are important in promoting health and wellness.  Follow your health care provider's instructions about healthy diet, exercising, and getting tested or screened for diseases.  Follow your health care provider's instructions on monitoring your cholesterol and blood pressure. This information is not intended to replace advice given to you by your health care provider. Make sure you discuss any questions you have with your health care provider. Document Revised: 12/01/2018 Document Reviewed: 12/01/2018 Elsevier Patient Education  2021 [employer].

## 2021-02-11 ENCOUNTER — Ambulatory Visit (INDEPENDENT_AMBULATORY_CARE_PROVIDER_SITE_OTHER): Payer: Self-pay | Admitting: Family Medicine

## 2021-02-11 ENCOUNTER — Other Ambulatory Visit: Payer: Self-pay

## 2021-02-11 ENCOUNTER — Encounter: Payer: Self-pay | Admitting: Family Medicine

## 2021-02-11 VITALS — BP 142/85 | HR 70 | Resp 18 | Ht 71.75 in | Wt 247.0 lb

## 2021-02-11 DIAGNOSIS — F410 Panic disorder [episodic paroxysmal anxiety] without agoraphobia: Secondary | ICD-10-CM

## 2021-02-11 DIAGNOSIS — F4010 Social phobia, unspecified: Secondary | ICD-10-CM

## 2021-02-11 DIAGNOSIS — Z131 Encounter for screening for diabetes mellitus: Secondary | ICD-10-CM

## 2021-02-11 DIAGNOSIS — Z1159 Encounter for screening for other viral diseases: Secondary | ICD-10-CM

## 2021-02-11 DIAGNOSIS — F330 Major depressive disorder, recurrent, mild: Secondary | ICD-10-CM

## 2021-02-11 DIAGNOSIS — Z130 Encounter for screening for diseases of the blood and blood-forming organs and certain disorders involving the immune mechanism: Secondary | ICD-10-CM

## 2021-02-11 DIAGNOSIS — Z114 Encounter for screening for human immunodeficiency virus [HIV]: Secondary | ICD-10-CM

## 2021-02-11 DIAGNOSIS — Z13220 Encounter for screening for lipoid disorders: Secondary | ICD-10-CM

## 2021-02-11 LAB — HEMOGLOBIN A1C: Hgb A1c MFr Bld: 5.5 % (ref 4.6–6.5)

## 2021-02-11 LAB — COMPREHENSIVE METABOLIC PANEL
ALT: 24 U/L (ref 0–53)
AST: 18 U/L (ref 0–37)
Albumin: 4.6 g/dL (ref 3.5–5.2)
Alkaline Phosphatase: 60 U/L (ref 39–117)
BUN: 15 mg/dL (ref 6–23)
CO2: 30 mEq/L (ref 19–32)
Calcium: 9.9 mg/dL (ref 8.4–10.5)
Chloride: 102 mEq/L (ref 96–112)
Creatinine, Ser: 0.88 mg/dL (ref 0.40–1.50)
GFR: 120.64 mL/min (ref >60.00)
Glucose, Bld: 98 mg/dL (ref 70–99)
Potassium: 4.6 mEq/L (ref 3.5–5.1)
Sodium: 139 mEq/L (ref 135–145)
Total Bilirubin: 0.5 mg/dL (ref 0.2–1.2)
Total Protein: 7.4 g/dL (ref 6.0–8.3)

## 2021-02-11 LAB — CBC
HCT: 47.5 % (ref 39.0–52.0)
Hemoglobin: 15.7 g/dL (ref 13.0–17.0)
MCHC: 33.2 g/dL (ref 30.0–36.0)
MCV: 90 fl (ref 78.0–100.0)
Platelets: 218 10*3/uL (ref 150.0–400.0)
RBC: 5.27 Mil/uL (ref 4.22–5.81)
RDW: 13.4 % (ref 11.5–15.5)
WBC: 6.7 10*3/uL (ref 4.0–10.5)

## 2021-02-11 LAB — LIPID PANEL
Cholesterol: 172 mg/dL (ref 0–200)
HDL: 53.6 mg/dL (ref >39.00)
LDL Cholesterol: 97 mg/dL (ref 0–99)
NonHDL: 118.29
Total CHOL/HDL Ratio: 3
Triglycerides: 108 mg/dL (ref 0.0–149.0)
VLDL: 21.6 mg/dL (ref 0.0–40.0)

## 2021-02-11 MED ORDER — PROPRANOLOL HCL 10 MG PO TABS: ORAL_TABLET | ORAL | 3 refills | Status: DC

## 2021-02-11 MED ORDER — HYDROXYZINE HCL 25 MG PO TABS: 25.0000 mg | ORAL_TABLET | Freq: Every day | ORAL | 1 refills | Status: DC | PRN

## 2021-02-11 MED ORDER — FLUOXETINE HCL 20 MG PO TABS: 20.0000 mg | ORAL_TABLET | Freq: Every day | ORAL | 3 refills | Status: DC

## 2021-02-25 ENCOUNTER — Other Ambulatory Visit: Payer: Self-pay | Admitting: Family Medicine

## 2021-02-25 DIAGNOSIS — F330 Major depressive disorder, recurrent, mild: Secondary | ICD-10-CM

## 2021-03-05 ENCOUNTER — Other Ambulatory Visit: Payer: Self-pay | Admitting: Family Medicine

## 2021-03-05 DIAGNOSIS — R1013 Epigastric pain: Secondary | ICD-10-CM

## 2021-03-06 IMAGING — DX DG ABDOMEN 1V
1 series · 1 of 1 positions shown · non-contrast
Comparison: None.

CLINICAL DATA: Abdomen pain

EXAM:
ABDOMEN - 1 VIEW

[abdomen kub]
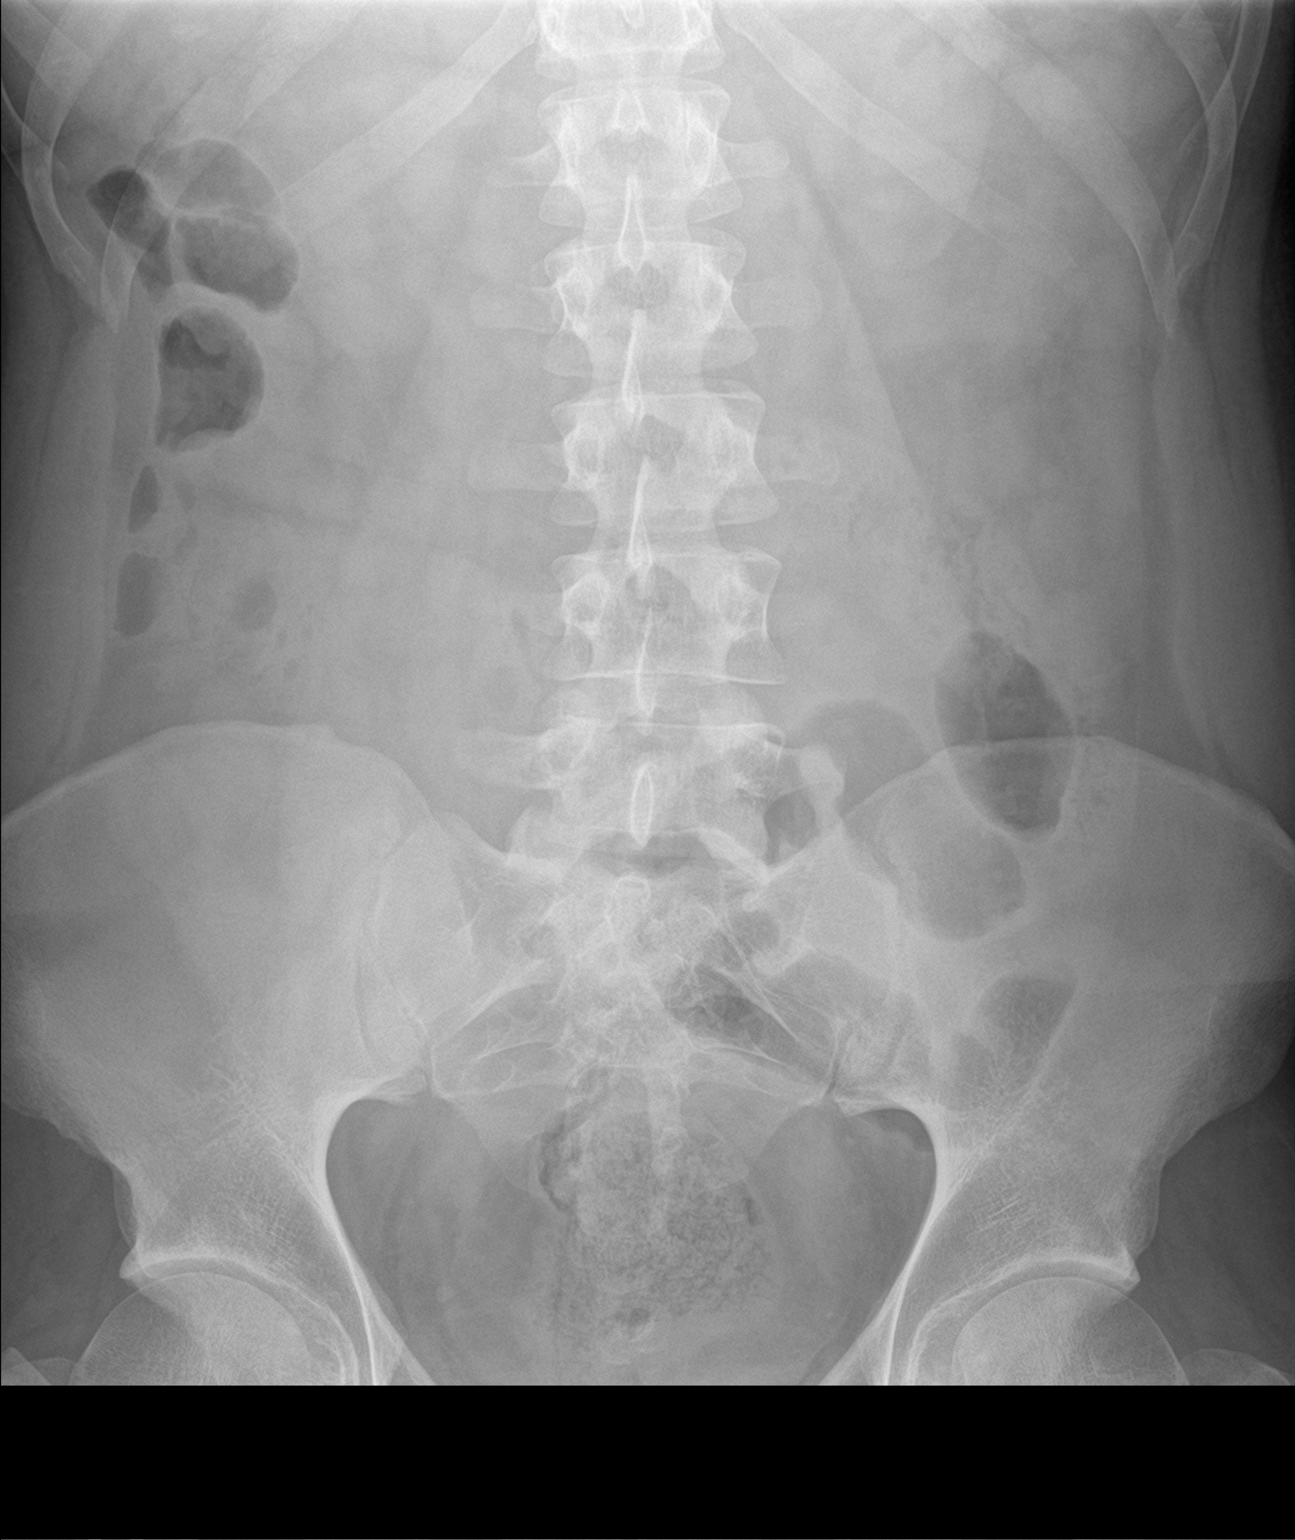

[1 of 1 positions shown; findings below may reference images not displayed]

FINDINGS: The bowel gas pattern is normal. No radio-opaque calculi or other
significant radiographic abnormality are seen.
IMPRESSION: Negative.

## 2021-09-17 ENCOUNTER — Other Ambulatory Visit: Payer: Self-pay | Admitting: Family Medicine

## 2021-09-17 DIAGNOSIS — F410 Panic disorder [episodic paroxysmal anxiety] without agoraphobia: Secondary | ICD-10-CM

## 2021-09-17 DIAGNOSIS — F4010 Social phobia, unspecified: Secondary | ICD-10-CM

## 2021-10-14 ENCOUNTER — Telehealth: Payer: Self-pay

## 2021-10-14 DIAGNOSIS — F330 Major depressive disorder, recurrent, mild: Secondary | ICD-10-CM

## 2021-10-14 DIAGNOSIS — F4010 Social phobia, unspecified: Secondary | ICD-10-CM

## 2021-10-14 DIAGNOSIS — F410 Panic disorder [episodic paroxysmal anxiety] without agoraphobia: Secondary | ICD-10-CM

## 2021-10-14 MED ORDER — PROPRANOLOL HCL 10 MG PO TABS: ORAL_TABLET | ORAL | 0 refills | Status: DC

## 2021-10-14 MED ORDER — FLUOXETINE HCL 20 MG PO TABS: 20.0000 mg | ORAL_TABLET | Freq: Every day | ORAL | 0 refills | Status: DC

## 2021-10-14 NOTE — Telephone Encounter (Signed)
Patient requesting refills for propranolol and fluoxetine (both pended).  Patient was started on fluoxetine at last office visit (02/11/21), has no f/u scheduled.  Okay to refill or would you like to see him first?

## 2022-01-14 NOTE — Progress Notes (Deleted)
[place] [facility] [address] [place], [place] [address] [phone]  Date:  01/16/2022   Name:  Xavier Olson   DOB:  [birth year]   MRN:  [id number]  PCP:  Darreld Mclean, MD    Chief Complaint: No chief complaint on file.   History of Present Illness:  Xavier Olson is a 26 y.o. very pleasant male patient who presents with the following:  Patient is seen today for follow-up, virtual visit Patient location is home, my location is office.  Patient identity confirmed with 2 factors, he gives consent for virtual visit today The patient and myself are present on the call today Most recent visit about 1 year ago History of social anxiety and panic attacks, depression At our visit about a year ago [name] was under a lot of stress due to family tension I started him on fluoxetine 20 and also prescribed hydroxyzine to use as needed He was also taking Inderal 10 twice a day as needed  Patient Active Problem List   Diagnosis Date Noted   Social anxiety disorder 03/17/2016   Panic attacks 03/17/2016   Facial flushing 03/17/2016   Depression 03/17/2016    Past Medical History:  Diagnosis Date   Irregular heart beat    MRSA (methicillin resistant staph aureus) culture positive    Nystagmus     Past Surgical History:  Procedure Laterality Date   EYE SURGERY  age 59    Social History   Tobacco Use   Smoking status: Some Days    Types: Cigars   Smokeless tobacco: Never   Tobacco comments:    2 cigars per month  Substance Use Topics   Alcohol use: No    Alcohol/week: 0.0 standard drinks   Drug use: No    Family History  Problem Relation Age of Onset   Thyroid disease Maternal Grandmother    Heart Problems Maternal Grandfather    Thyroid disease Maternal Grandfather    Thyroid disease Paternal Grandmother    Heart disease Paternal Grandfather    Thyroid disease Paternal Grandfather     No Known  Allergies  Medication list has been reviewed and updated.  Current Outpatient Medications on File Prior to Visit  Medication Sig Dispense Refill   FLUoxetine (PROZAC) 20 MG tablet Take 1 tablet (20 mg total) by mouth daily. 90 tablet 0   hydrOXYzine (ATARAX/VISTARIL) 25 MG tablet TAKE 1 TABLET BY MOUTH DAILY AS NEEDED FOR ANXIETY. 90 tablet 1   propranolol (INDERAL) 10 MG tablet TAKE 1 TABLET BY MOUTH TWICE DAILY AS NEEDED FOR SHAKINESS OR ANXIETY 180 tablet 0   No current facility-administered medications on file prior to visit.    Review of Systems:  As per HPI- otherwise negative.   Physical Examination: There were no vitals filed for this visit. There were no vitals filed for this visit. There is no height or weight on file to calculate BMI. Ideal Body Weight:    ***  Assessment and Plan: ***  Signed Lamar Blinks, MD

## 2022-01-16 ENCOUNTER — Telehealth: Payer: Self-pay | Admitting: Family Medicine

## 2022-01-25 NOTE — Progress Notes (Signed)
[facility] [address], [address] [place], [place] [address] [phone]  Date:  01/27/2022   Name:  Xavier Olson   DOB:  [birth year]   MRN:  [id number]  PCP:  Pearline Cables, MD    Chief Complaint: No chief complaint on file.   History of Present Illness:  Xavier Olson is a 26 y.o. very pleasant male patient who presents with the following:  Patient seen today for periodic follow-up-virtual visit today History of social anxiety disorder, panic attacks, depression Patient location is home, my location is office Patient identity confirmed with 2 factors, he gives consent for virtual visit today The patient and myself are present on the call today Most recent visit with myself was 1 year ago  At last visit he was using propranolol for shakiness and anxiety He was having more symptoms with anxiety and depression, I had him started fluoxetine 20 and encouraged Atarax as needed for sleep or panic attacks  Pt is in the process of moving to a new appt  He notes he got a new job- he is now working as a Nature conservation officer.   He has been off all his meds for about 2 months- he ran out and did not get them refilled  He never took the fluoxetine- he states the pharmacy never filled it? He would like to start fluoxetine He would like to continue taking the hydroxyzine as well, this does help w anxiety He was taking this in the am prior to work- it helped with the drive to work and with getting started with his day  He would like to restart propranolol as well -he notes this was also helpful to him  His problem is more anxiety than depression.  He states no suicidal ideation  He did set up his MyChart account to make it easier to communicate  Patient Active Problem List   Diagnosis Date Noted   Social anxiety disorder 03/17/2016   Panic attacks 03/17/2016   Facial flushing 03/17/2016   Depression 03/17/2016    Past Medical  History:  Diagnosis Date   Irregular heart beat    MRSA (methicillin resistant staph aureus) culture positive    Nystagmus     Past Surgical History:  Procedure Laterality Date   EYE SURGERY  age 41    Social History   Tobacco Use   Smoking status: Some Days    Types: Cigars   Smokeless tobacco: Never   Tobacco comments:    2 cigars per month  Substance Use Topics   Alcohol use: No    Alcohol/week: 0.0 standard drinks   Drug use: No    Family History  Problem Relation Age of Onset   Thyroid disease Maternal Grandmother    Heart Problems Maternal Grandfather    Thyroid disease Maternal Grandfather    Thyroid disease Paternal Grandmother    Heart disease Paternal Grandfather    Thyroid disease Paternal Grandfather     No Known Allergies  Medication list has been reviewed and updated.  Current Outpatient Medications on File Prior to Visit  Medication Sig Dispense Refill   FLUoxetine (PROZAC) 20 MG tablet Take 1 tablet (20 mg total) by mouth daily. 90 tablet 0   hydrOXYzine (ATARAX/VISTARIL) 25 MG tablet TAKE 1 TABLET BY MOUTH DAILY AS NEEDED FOR ANXIETY. 90 tablet 1   propranolol (INDERAL) 10 MG tablet TAKE 1 TABLET BY MOUTH TWICE DAILY AS NEEDED FOR SHAKINESS OR  ANXIETY 180 tablet 0   No current facility-administered medications on file prior to visit.    Review of Systems:  As per HPI- otherwise negative.   Physical Examination: There were no vitals filed for this visit. There were no vitals filed for this visit. There is no height or weight on file to calculate BMI. Ideal Body Weight:   Patient observed her video monitor.  He looks well, his normal self.  No distress or shortness of breath is noted  Assessment and Plan: Social anxiety disorder - Plan: hydrOXYzine (ATARAX) 25 MG tablet, propranolol (INDERAL) 10 MG tablet  Panic attacks - Plan: hydrOXYzine (ATARAX) 25 MG tablet, propranolol (INDERAL) 10 MG tablet  Mild episode of recurrent major  depressive disorder (HCC) - Plan: FLUoxetine (PROZAC) 20 MG tablet  Virtual visit today for concern of anxiety and panic. Video used for duration of visit.  I prescribed fluoxetine for him last year but he never obtained it from the pharmacy?  He would like to give this a try, sending prescription today  I also refilled hydroxyzine and propranolol which she was using until he ran out 2 months ago  He agrees to let me know if any difficulty getting his prescriptions filled from the pharmacy  He will also let me know how he does with the fluoxetine  Signed Abbe Amsterdam, MD

## 2022-01-27 ENCOUNTER — Telehealth (INDEPENDENT_AMBULATORY_CARE_PROVIDER_SITE_OTHER): Payer: Self-pay | Admitting: Family Medicine

## 2022-01-27 DIAGNOSIS — F4010 Social phobia, unspecified: Secondary | ICD-10-CM

## 2022-01-27 DIAGNOSIS — F330 Major depressive disorder, recurrent, mild: Secondary | ICD-10-CM

## 2022-01-27 DIAGNOSIS — F410 Panic disorder [episodic paroxysmal anxiety] without agoraphobia: Secondary | ICD-10-CM

## 2022-01-27 MED ORDER — HYDROXYZINE HCL 25 MG PO TABS: 25.0000 mg | ORAL_TABLET | Freq: Every day | ORAL | 1 refills | Status: DC | PRN

## 2022-01-27 MED ORDER — FLUOXETINE HCL 20 MG PO TABS: 20.0000 mg | ORAL_TABLET | Freq: Every day | ORAL | 1 refills | Status: DC

## 2022-01-27 MED ORDER — PROPRANOLOL HCL 10 MG PO TABS: ORAL_TABLET | ORAL | 1 refills | Status: DC

## 2022-05-12 ENCOUNTER — Other Ambulatory Visit: Payer: Self-pay

## 2022-05-12 ENCOUNTER — Telehealth: Payer: Self-pay | Admitting: Family Medicine

## 2022-05-12 DIAGNOSIS — F330 Major depressive disorder, recurrent, mild: Secondary | ICD-10-CM

## 2022-05-12 DIAGNOSIS — F410 Panic disorder [episodic paroxysmal anxiety] without agoraphobia: Secondary | ICD-10-CM

## 2022-05-12 DIAGNOSIS — F4010 Social phobia, unspecified: Secondary | ICD-10-CM

## 2022-05-12 MED ORDER — FLUOXETINE HCL 20 MG PO TABS: 20.0000 mg | ORAL_TABLET | Freq: Every day | ORAL | 1 refills | Status: DC

## 2022-05-12 MED ORDER — HYDROXYZINE HCL 25 MG PO TABS: 25.0000 mg | ORAL_TABLET | Freq: Every day | ORAL | 1 refills | Status: DC | PRN

## 2022-05-12 MED ORDER — PROPRANOLOL HCL 10 MG PO TABS: ORAL_TABLET | ORAL | 1 refills | Status: DC

## 2022-05-12 NOTE — Telephone Encounter (Signed)
Medication:   hydrOXYzine (ATARAX) 25 MG tablet [id number]   propranolol (INDERAL) 10 MG tablet [id number]   FLUoxetine (PROZAC) 20 MG tablet [id number]   Has the patient contacted their pharmacy? No. (If no, request that the patient contact the pharmacy for the refill.) (If yes, when and what did the pharmacy advise?)  Preferred Pharmacy (with phone number or street name):   [id number] - [place] [place]  [address] [place] [address]  Phone:  [phone]  Fax:  [phone]  Agent: Please be advised that RX refills may take up to 3 business days. We ask that you follow-up with your pharmacy.

## 2022-05-12 NOTE — Telephone Encounter (Signed)
Refills sent

## 2022-06-25 ENCOUNTER — Telehealth: Payer: Self-pay | Admitting: Family Medicine

## 2022-06-25 ENCOUNTER — Encounter: Payer: Self-pay | Admitting: Family Medicine

## 2022-06-25 DIAGNOSIS — L600 Ingrowing nail: Secondary | ICD-10-CM

## 2022-06-25 NOTE — Telephone Encounter (Signed)
Pt is needing an ingrown toenail removal. Went ahead and scheduled him for Monday as there is limited availability, please advise is pcp is able to do them. If not I will call pt.

## 2022-06-25 NOTE — Telephone Encounter (Signed)
Do you do these procedures?

## 2022-06-26 NOTE — Telephone Encounter (Signed)
Message sent via MyChart:  "Hi Rasheed-I saw they scheduled you to see me for an ingrown toenail on Monday.  I am afraid I have not removed a toenail in over 15 years-if you strongly suspect the toenail needs to be removed we should probably have you see someone who does this this procedure.  However, if you think more a minor ingrown in need of antibiotics/wound care I am glad to take a look at it for you   That being said, if you think this is more of a minor issue you might also consider doing a virtual or E-visit this week-that way you do not have to wait   Let me know what you think!  JC"

## 2022-06-26 NOTE — Telephone Encounter (Signed)
Patients reply:  "Called patient to discuss appointment this coming Monday for toenail removal, procedure that I no longer do.  However, patient actually needed to cancel his appointment due to his schedule.  He notes that the ingrown toenail is nonacute, has been this way for quite some time.  I offered to make a podiatry referral and he accepts"

## 2022-06-26 NOTE — Telephone Encounter (Signed)
Called patient to discuss appointment this coming Monday for toenail removal, procedure that I no longer do.  However, patient actually needed to cancel his appointment due to his schedule.  He notes that the ingrown toenail is nonacute, has been this way for quite some time.  I offered to make a podiatry referral and he accepts

## 2022-06-30 ENCOUNTER — Ambulatory Visit: Payer: Self-pay | Admitting: Family Medicine

## 2022-09-19 ENCOUNTER — Ambulatory Visit (INDEPENDENT_AMBULATORY_CARE_PROVIDER_SITE_OTHER): Payer: Self-pay | Admitting: Podiatry

## 2022-09-19 DIAGNOSIS — L600 Ingrowing nail: Secondary | ICD-10-CM

## 2022-09-19 DIAGNOSIS — M79674 Pain in right toe(s): Secondary | ICD-10-CM

## 2022-09-19 NOTE — Patient Instructions (Signed)

## 2022-09-20 NOTE — Progress Notes (Signed)
Subjective:   Patient ID: Xavier Olson, male   DOB: 26 y.o.   MRN: [id number]   HPI Chief Complaint  Patient presents with   Ingrown Toenail    R great toe, ingrown nail, infected   26 year old male presents the above concerns.  States the right toe has become ingrown which is a chronic issue but seems to be getting worse and is intermittent.  Currently no drainage or pus.  Review of Systems  All other systems reviewed and are negative.  Past Medical History:  Diagnosis Date   Irregular heart beat    MRSA (methicillin resistant staph aureus) culture positive    Nystagmus     Past Surgical History:  Procedure Laterality Date   EYE SURGERY  age 66     Current Outpatient Medications:    FLUoxetine (PROZAC) 20 MG tablet, Take 1 tablet (20 mg total) by mouth daily., Disp: 90 tablet, Rfl: 1   hydrOXYzine (ATARAX) 25 MG tablet, Take 1 tablet (25 mg total) by mouth daily as needed. for anxiety, Disp: 90 tablet, Rfl: 1   propranolol (INDERAL) 10 MG tablet, TAKE 1 TABLET BY MOUTH TWICE DAILY AS NEEDED FOR SHAKINESS OR ANXIETY, Disp: 180 tablet, Rfl: 1  No Known Allergies        Objective:  Physical Exam  General: AAO x3, NAD  Dermatological: Incurvation present to right hallux toenail mostly lateral aspect.  There is localized edema and erythema but there is no drainage or pus or ascending cellulitis.  No fluctuance or crepitation.  No signs of an abscess.  Vascular: Dorsalis Pedis artery and Posterior Tibial artery pedal pulses are 2/4 bilateral with immedate capillary fill time. There is no pain with calf compression, swelling, warmth, erythema.   Neruologic: Grossly intact via light touch bilateral.   Musculoskeletal: Tenderness in the entire toenail but no other areas of discomfort.  Gait: Unassisted, Nonantalgic.       Assessment:   Right hallux ingrown toenail     Plan:  -Treatment options discussed including all alternatives, risks, and  complications -Etiology of symptoms were discussed -At this time, the patient is requesting partial nail removal with chemical matricectomy to the symptomatic portion of the nail. Risks and complications were discussed with the patient for which they understand and written consent was obtained. Under sterile conditions a total of 3 mL of a mixture of 2% lidocaine plain and 0.5% Marcaine plain was infiltrated in a hallux block fashion.  Additional 1.5 cc was utilized to ensure anesthesia.  Once anesthetized, the skin was prepped in sterile fashion. A tourniquet was then applied. Next the symptomatic aspect of hallux nail border was then sharply excised making sure to remove the entire offending nail border. Once the nails were ensured to be removed area was debrided and the underlying skin was intact. There is no purulence identified in the procedure. Next phenol was then applied under standard conditions and copiously irrigated. Silvadene was applied. A dry sterile dressing was applied. After application of the dressing the tourniquet was removed and there is found to be an immediate capillary refill time to the digit. The patient tolerated the procedure well any complications. Post procedure instructions were discussed the patient for which he verbally understood.  Discussed signs/symptoms of infection and directed to call the office immediately should any occur or go directly to the emergency room. In the meantime, encouraged to call the office with any questions, concerns, changes symptoms.  *Patient sat in the waiting room for an  extended period of time as he was failed to be check-in appropriately.  I apologized for the delay.  Trula Slade DPM

## 2023-03-01 ENCOUNTER — Other Ambulatory Visit: Payer: Self-pay | Admitting: Family Medicine

## 2023-03-01 DIAGNOSIS — F4010 Social phobia, unspecified: Secondary | ICD-10-CM

## 2023-03-01 DIAGNOSIS — F330 Major depressive disorder, recurrent, mild: Secondary | ICD-10-CM

## 2023-03-01 DIAGNOSIS — F410 Panic disorder [episodic paroxysmal anxiety] without agoraphobia: Secondary | ICD-10-CM

## 2023-03-02 ENCOUNTER — Encounter: Payer: Self-pay | Admitting: *Deleted

## 2023-03-18 ENCOUNTER — Encounter: Payer: Self-pay | Admitting: *Deleted

## 2023-06-05 ENCOUNTER — Other Ambulatory Visit: Payer: Self-pay | Admitting: Family Medicine

## 2023-06-05 DIAGNOSIS — F410 Panic disorder [episodic paroxysmal anxiety] without agoraphobia: Secondary | ICD-10-CM

## 2023-06-05 DIAGNOSIS — F4010 Social phobia, unspecified: Secondary | ICD-10-CM

## 2023-06-09 ENCOUNTER — Other Ambulatory Visit: Payer: Self-pay | Admitting: Family Medicine

## 2023-06-09 DIAGNOSIS — F330 Major depressive disorder, recurrent, mild: Secondary | ICD-10-CM

## 2023-06-23 ENCOUNTER — Other Ambulatory Visit: Payer: Self-pay | Admitting: Family Medicine

## 2023-06-23 DIAGNOSIS — F330 Major depressive disorder, recurrent, mild: Secondary | ICD-10-CM

## 2023-09-21 NOTE — Progress Notes (Unsigned)
[facility] [address] [place], [place] [address] [phone]  Date:  09/24/2023   Name:  Xavier Olson   DOB:  1996-08-08   MRN:  [id number]  PCP:  Pearline Cables, MD    Chief Complaint: No chief complaint on file.   History of Present Illness:  Xavier Olson is a 27 y.o. very pleasant male patient who presents with the following:  Patient seen today for medication follow-up Most recent visit with myself was February 2022-at that time he noted concern of social anxiety, panic attacks, depression Was working at [employer] and endorsed symptoms of easy distractibility, depression and anxiety  I started him on propranolol and fluoxetine but we have not seen him since  Patient Active Problem List   Diagnosis Date Noted   Social anxiety disorder 03/17/2016   Panic attacks 03/17/2016   Facial flushing 03/17/2016   Depression 03/17/2016    Past Medical History:  Diagnosis Date   Irregular heart beat    MRSA (methicillin resistant staph aureus) culture positive    Nystagmus     Past Surgical History:  Procedure Laterality Date   EYE SURGERY  age 81    Social History   Tobacco Use   Smoking status: Some Days    Types: Cigars   Smokeless tobacco: Never   Tobacco comments:    2 cigars per month  Substance Use Topics   Alcohol use: No    Alcohol/week: 0.0 standard drinks of alcohol   Drug use: No    Family History  Problem Relation Age of Onset   Thyroid disease Maternal Grandmother    Heart Problems Maternal Grandfather    Thyroid disease Maternal Grandfather    Thyroid disease Paternal Grandmother    Heart disease Paternal Grandfather    Thyroid disease Paternal Grandfather     No Known Allergies  Medication list has been reviewed and updated.  Current Outpatient Medications on File Prior to Visit  Medication Sig Dispense Refill   FLUoxetine (PROZAC) 20 MG tablet Take 1  tablet (20 mg total) by mouth daily. 30 tablet 0   hydrOXYzine (ATARAX) 25 MG tablet TAKE 1 TABLET (25 MG TOTAL) BY MOUTH DAILY AS NEEDED. FOR ANXIETY 90 tablet 0   propranolol (INDERAL) 10 MG tablet TAKE 1 TABLET BY MOUTH TWICE DAILY AS NEEDED FOR SHAKINESS OR ANXIETY 180 tablet 0   No current facility-administered medications on file prior to visit.    Review of Systems:  As per HPI- otherwise negative.   Physical Examination: There were no vitals filed for this visit. There were no vitals filed for this visit. There is no height or weight on file to calculate BMI. Ideal Body Weight:    GEN: no acute distress. HEENT: Atraumatic, Normocephalic.  Ears and Nose: No external deformity. CV: RRR, No M/G/R. No JVD. No thrill. No extra heart sounds. PULM: CTA B, no wheezes, crackles, rhonchi. No retractions. No resp. distress. No accessory muscle use. ABD: S, NT, ND, +BS. No rebound. No HSM. EXTR: No c/c/e PSYCH: Normally interactive. Conversant.    Assessment and Plan: ***  Signed Abbe Amsterdam, MD

## 2023-09-24 ENCOUNTER — Other Ambulatory Visit: Payer: Self-pay

## 2023-09-24 ENCOUNTER — Encounter (HOSPITAL_BASED_OUTPATIENT_CLINIC_OR_DEPARTMENT_OTHER): Payer: Self-pay | Admitting: Emergency Medicine

## 2023-09-24 ENCOUNTER — Emergency Department (HOSPITAL_BASED_OUTPATIENT_CLINIC_OR_DEPARTMENT_OTHER)
Admission: EM | Admit: 2023-09-24 | Discharge: 2023-09-25 | Disposition: A | Discharge status: Other Acute Inpatient Hospital | Payer: Medicaid Other | Source: Home / Self Care | Attending: Emergency Medicine | Admitting: Emergency Medicine

## 2023-09-24 ENCOUNTER — Ambulatory Visit: Payer: Medicaid Other | Admitting: Family Medicine

## 2023-09-24 VITALS — BP 136/72 | HR 83 | Temp 97.8°F | Resp 18 | Ht 71.75 in | Wt 253.8 lb

## 2023-09-24 DIAGNOSIS — F332 Major depressive disorder, recurrent severe without psychotic features: Secondary | ICD-10-CM | POA: Insufficient documentation

## 2023-09-24 DIAGNOSIS — F411 Generalized anxiety disorder: Secondary | ICD-10-CM | POA: Insufficient documentation

## 2023-09-24 DIAGNOSIS — R45851 Suicidal ideations: Secondary | ICD-10-CM | POA: Insufficient documentation

## 2023-09-24 DIAGNOSIS — F840 Autistic disorder: Secondary | ICD-10-CM | POA: Insufficient documentation

## 2023-09-24 DIAGNOSIS — T1491XA Suicide attempt, initial encounter: Secondary | ICD-10-CM

## 2023-09-24 LAB — COMPREHENSIVE METABOLIC PANEL
ALT: 19 U/L (ref 0–44)
AST: 21 U/L (ref 15–41)
Albumin: 4.5 g/dL (ref 3.5–5.0)
Alkaline Phosphatase: 64 U/L (ref 38–126)
Anion gap: 9 (ref 5–15)
BUN: 11 mg/dL (ref 6–20)
CO2: 23 mmol/L (ref 22–32)
Calcium: 9.1 mg/dL (ref 8.9–10.3)
Chloride: 102 mmol/L (ref 98–111)
Creatinine, Ser: 0.77 mg/dL (ref 0.61–1.24)
GFR, Estimated: >60 mL/min (ref >60)
Glucose, Bld: 91 mg/dL (ref 70–99)
Potassium: 3.6 mmol/L (ref 3.5–5.1)
Sodium: 134 mmol/L — ABNORMAL LOW (ref 135–145)
Total Bilirubin: 0.7 mg/dL (ref 0.3–1.2)
Total Protein: 7.8 g/dL (ref 6.5–8.1)

## 2023-09-24 LAB — ACETAMINOPHEN LEVEL: Acetaminophen (Tylenol), Serum: <10 ug/mL — ABNORMAL LOW (ref 10–30)

## 2023-09-24 LAB — ETHANOL: Alcohol, Ethyl (B): <10 mg/dL (ref <10)

## 2023-09-24 LAB — RAPID URINE DRUG SCREEN, HOSP PERFORMED
Amphetamines: NOT DETECTED
Barbiturates: NOT DETECTED
Benzodiazepines: NOT DETECTED
Cocaine: NOT DETECTED
Opiates: NOT DETECTED
Tetrahydrocannabinol: NOT DETECTED

## 2023-09-24 LAB — CBC
HCT: 47.2 % (ref 39.0–52.0)
Hemoglobin: 16.6 g/dL (ref 13.0–17.0)
MCH: 30.9 pg (ref 26.0–34.0)
MCHC: 35.2 g/dL (ref 30.0–36.0)
MCV: 87.7 fL (ref 80.0–100.0)
Platelets: 251 10*3/uL (ref 150–400)
RBC: 5.38 MIL/uL (ref 4.22–5.81)
RDW: 11.9 % (ref 11.5–15.5)
WBC: 6.4 10*3/uL (ref 4.0–10.5)
nRBC: 0 % (ref 0.0–0.2)

## 2023-09-24 LAB — SALICYLATE LEVEL: Salicylate Lvl: <7 mg/dL — ABNORMAL LOW (ref 7.0–30.0)

## 2023-09-24 NOTE — BH Assessment (Addendum)
At 15:03, a TTS consult order was received for [facility]. The patient's care team, including Toribio Harbour, MD, Elpidio Anis, [place], Chaney Malling, MD,  and Candy Swaz[place], RN, has been informed of the TTS order. They were notified that a member of the TTS team will evaluate the patient as soon as a staff member becomes available. Updates will be provided when a specific time for the patient's assessment is established. This notification was sent to the care team at 1552.
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
## 2023-09-24 NOTE — ED Notes (Signed)
TTS assessment complete. 

## 2023-09-24 NOTE — ED Notes (Signed)
TTS assessment in process  

## 2023-09-24 NOTE — ED Provider Notes (Signed)
[facility] Provider Note   CSN: [id number] Arrival date & time: 09/24/23  1416     History  Chief Complaint  Patient presents with   Suicide Attempt    Xavier Olson is a 27 y.o. male.  Patient was seen earlier today by PCP, Warner Mccreedy, who became concerned that the patient was having suicidal thoughts. She sent the patient to the ED (same building) with nurse escort but the patient ran away. He returned on his own. Per chart review, the patient had expressed to Warner Mccreedy that he's had multiple instances of self harm by cutting with a knife and ongoing feelings of wanting to kill himself. In the ED communication is withdrawn and he answers only that he has problems "in my head, mental" but when asked if he is having SI/HI/AVH, he only responds with "I can't".   The history is provided by the patient and medical records. No language interpreter was used.       Home Medications Prior to Admission medications   Medication Sig Start Date End Date Taking? Authorizing Provider  FLUoxetine (PROZAC) 20 MG tablet Take 1 tablet (20 mg total) by mouth daily. Patient not taking: Reported on 09/24/2023 06/09/23   Copland, Gwenlyn Found, MD  hydrOXYzine (ATARAX) 25 MG tablet TAKE 1 TABLET (25 MG TOTAL) BY MOUTH DAILY AS NEEDED. FOR ANXIETY Patient not taking: Reported on 09/24/2023 03/02/23   Copland, Gwenlyn Found, MD  propranolol (INDERAL) 10 MG tablet TAKE 1 TABLET BY MOUTH TWICE DAILY AS NEEDED FOR SHAKINESS OR ANXIETY Patient not taking: Reported on 09/24/2023 03/02/23   Copland, Gwenlyn Found, MD      Allergies    Patient has no known allergies.    Review of Systems   Review of Systems  Physical Exam Updated Vital Signs BP (!) 141/94   Pulse 86   Temp 98.6 F (37 C) (Oral)   Resp 16   Wt 115 kg   SpO2 98%   BMI 34.62 kg/m  Physical Exam Vitals and nursing note reviewed.  HENT:     Head: Atraumatic.  Cardiovascular:     Rate  and Rhythm: Normal rate.  Pulmonary:     Effort: Pulmonary effort is normal.  Musculoskeletal:        General: Normal range of motion.     Cervical back: Normal range of motion.  Skin:    General: Skin is warm and dry.  Neurological:     Mental Status: He is alert.     Gait: Gait normal.  Psychiatric:        Mood and Affect: Mood is depressed. Affect is blunt.        Speech: He is noncommunicative.        Behavior: Behavior is uncooperative and slowed.        Thought Content: Thought content includes suicidal ideation.        Judgment: Judgment is impulsive.     Comments: Poor eye contact. Sitting in chair rocking back and forth hugging a pillow.     ED Results / Procedures / Treatments   Labs (all labs ordered are listed, but only abnormal results are displayed) Labs Reviewed  COMPREHENSIVE METABOLIC PANEL - Abnormal; Notable for the following components:      Result Value   Sodium 134 (*)    All other components within normal limits  SALICYLATE LEVEL - Abnormal; Notable for the following components:   Salicylate Lvl <7.0 (*)  All other components within normal limits  ACETAMINOPHEN LEVEL - Abnormal; Notable for the following components:   Acetaminophen (Tylenol), Serum <10 (*)    All other components within normal limits  ETHANOL  CBC  RAPID URINE DRUG SCREEN, HOSP PERFORMED    EKG None  Radiology No results found.  Procedures Procedures    Medications Ordered in ED Medications - No data to display  ED Course/ Medical Decision Making/ A&P Clinical Course as of 09/24/23 2248  Thu Sep 24, 2023  1545 Patient more calm on recheck. Reassured him that an evaluation by psychiatry was for his benefit to get the help he needs and he acknowledges understanding. I offered to contact anyone for him and he declines.  [SU]  1704 Patient continues to be calm, cooperative.  [SU]    Clinical Course User Index [SU] Elpidio Anis, PA-C                                  Medical Decision Making This patient presents to the ED for concern of suicidal ideation, mental health instability, this involves an extensive number of treatment options, and is a complaint that carries with it a high risk of complications and morbidity.  The differential diagnosis includes acute psychosis, severe depression, AVH, SI, HI   Co morbidities that complicate the patient evaluation  Medication noncompliance  Social determinants of care: Father died by drug abuse, mother gone, living with brother  Additional history obtained:  Additional history obtained from Warner Mccreedy, MD External records from outside source obtained and reviewed including PCP notes, previous history of medical visits   Lab Tests:  I Ordered, and personally interpreted labs.  The pertinent results include:  Labs are negative for evidence of drug/substance use, evidence of infection, electrolyte abnormality, dehydration.    Imaging Studies ordered:  I ordered imaging studies including No studies indicated      Consultations Obtained:  I requested consultation with the TTS for psychiatric evaluation to aid in appropriate disposition,  and discussed lab and imaging findings as well as pertinent plan - they recommend: pending   Problem List / ED Course / Critical interventions / Medication management  Patient with history of SI, flight risk today, placed under IVC as psych evaluation felt necessary.  I ordered medication including No medications now that he is calm and cooperative. Will consider if he becomes agitated or combative.    Social Determinants of Health:  Medication noncompliance, limited family support   Test / Admission - Considered:  TTS consultation completed. Psychiatry recommending inpatient treatment. Will apprise when bed is available for transfer to [facility].    Amount and/or Complexity of Data Reviewed Labs: ordered.           Final Clinical  Impression(s) / ED Diagnoses Final diagnoses:  Suicidal ideation    Rx / DC Orders ED Discharge Orders     None         Elpidio Anis, PA-C 09/24/23 2249    Alvira Monday, MD 09/24/23 2315

## 2023-09-24 NOTE — ED Notes (Signed)
Patient requesting to call his mother. Phone and phone number provided to patient at this time. Sitter remains at bedside.

## 2023-09-24 NOTE — ED Notes (Signed)
Patient spoken with TTS @ 21:06 Waiting for the next steps

## 2023-09-24 NOTE — ED Notes (Signed)
Pt was screened/wanded by security

## 2023-09-24 NOTE — ED Triage Notes (Signed)
Pt sent from PCP for suicidal ideation , attempted to hurt himself with a bucher knife 1 month ago .  Admits to suicidal ideation today with a plan of stabbing himself .  Pt reports Hx autism, psychiatric concerns .  Pt admits to visual and auditory hallucinations  Pt appears sad , no eye contact .  Alert and oriented x 4  Denies ingestion or overdose .

## 2023-09-24 NOTE — ED Notes (Signed)
Patient given two frozen meals and diet coke per request. Patient calm and cooperative at this time.

## 2023-09-24 NOTE — ED Notes (Signed)
This nurse went to speak with pt. Pt  sitting in chair and reports that he doesn't want labs at this time. Police officer is also in room with pt.

## 2023-09-25 ENCOUNTER — Other Ambulatory Visit: Payer: Self-pay

## 2023-09-25 ENCOUNTER — Inpatient Hospital Stay (HOSPITAL_COMMUNITY)
Admission: AD | Admit: 2023-09-25 | Discharge: 2023-09-27 | DRG: 885 | Disposition: A | Discharge status: Home / Self Care | Payer: Medicaid Other | Source: Intra-hospital | Attending: Psychiatry | Admitting: Psychiatry

## 2023-09-25 ENCOUNTER — Encounter (HOSPITAL_COMMUNITY): Payer: Self-pay | Admitting: Family Medicine

## 2023-09-25 DIAGNOSIS — Z8249 Family history of ischemic heart disease and other diseases of the circulatory system: Secondary | ICD-10-CM | POA: Diagnosis not present

## 2023-09-25 DIAGNOSIS — F840 Autistic disorder: Secondary | ICD-10-CM | POA: Insufficient documentation

## 2023-09-25 DIAGNOSIS — F411 Generalized anxiety disorder: Secondary | ICD-10-CM | POA: Insufficient documentation

## 2023-09-25 DIAGNOSIS — Z91148 Patient's other noncompliance with medication regimen for other reason: Secondary | ICD-10-CM | POA: Diagnosis not present

## 2023-09-25 DIAGNOSIS — Z634 Disappearance and death of family member: Secondary | ICD-10-CM

## 2023-09-25 DIAGNOSIS — Z79899 Other long term (current) drug therapy: Secondary | ICD-10-CM

## 2023-09-25 DIAGNOSIS — F4310 Post-traumatic stress disorder, unspecified: Secondary | ICD-10-CM | POA: Insufficient documentation

## 2023-09-25 DIAGNOSIS — F332 Major depressive disorder, recurrent severe without psychotic features: Secondary | ICD-10-CM | POA: Diagnosis not present

## 2023-09-25 DIAGNOSIS — Z8614 Personal history of Methicillin resistant Staphylococcus aureus infection: Secondary | ICD-10-CM

## 2023-09-25 DIAGNOSIS — R45851 Suicidal ideations: Secondary | ICD-10-CM | POA: Diagnosis present

## 2023-09-25 DIAGNOSIS — F333 Major depressive disorder, recurrent, severe with psychotic symptoms: Principal | ICD-10-CM | POA: Diagnosis present

## 2023-09-25 DIAGNOSIS — Z9152 Personal history of nonsuicidal self-harm: Secondary | ICD-10-CM

## 2023-09-25 DIAGNOSIS — F323 Major depressive disorder, single episode, severe with psychotic features: Principal | ICD-10-CM | POA: Diagnosis present

## 2023-09-25 MED ORDER — DIPHENHYDRAMINE HCL 25 MG PO CAPS: 50.0000 mg | ORAL_CAPSULE | Freq: Three times a day (TID) | ORAL | Status: DC | PRN

## 2023-09-25 MED ORDER — LORAZEPAM 2 MG/ML IJ SOLN: 2.0000 mg | Freq: Three times a day (TID) | INTRAMUSCULAR | Status: DC | PRN

## 2023-09-25 MED ORDER — MAGNESIUM HYDROXIDE 400 MG/5ML PO SUSP
30.0000 mL | Freq: Every day | ORAL | Status: DC | PRN
  Administered 2023-09-26: 30 mL via ORAL
  Filled 2023-09-25: qty 30

## 2023-09-25 MED ORDER — HALOPERIDOL 5 MG PO TABS: 5.0000 mg | ORAL_TABLET | Freq: Three times a day (TID) | ORAL | Status: DC | PRN

## 2023-09-25 MED ORDER — HALOPERIDOL LACTATE 5 MG/ML IJ SOLN: 5.0000 mg | Freq: Three times a day (TID) | INTRAMUSCULAR | Status: DC | PRN

## 2023-09-25 MED ORDER — ACETAMINOPHEN 325 MG PO TABS
650.0000 mg | ORAL_TABLET | Freq: Four times a day (QID) | ORAL | Status: DC | PRN
  Administered 2023-09-26 (×2): 650 mg via ORAL
  Filled 2023-09-25 (×2): qty 2

## 2023-09-25 MED ORDER — DIPHENHYDRAMINE HCL 50 MG/ML IJ SOLN: 50.0000 mg | Freq: Three times a day (TID) | INTRAMUSCULAR | Status: DC | PRN

## 2023-09-25 MED ORDER — TRAZODONE HCL 50 MG PO TABS
50.0000 mg | ORAL_TABLET | Freq: Every evening | ORAL | Status: DC | PRN
  Administered 2023-09-25 – 2023-09-26 (×2): 50 mg via ORAL
  Filled 2023-09-25 (×2): qty 1

## 2023-09-25 MED ORDER — ALUM & MAG HYDROXIDE-SIMETH 200-200-20 MG/5ML PO SUSP: 30.0000 mL | ORAL | Status: DC | PRN

## 2023-09-25 MED ORDER — LORAZEPAM 1 MG PO TABS: 2.0000 mg | ORAL_TABLET | Freq: Three times a day (TID) | ORAL | Status: DC | PRN

## 2023-09-25 MED ORDER — HYDROXYZINE HCL 25 MG PO TABS
25.0000 mg | ORAL_TABLET | Freq: Three times a day (TID) | ORAL | Status: DC | PRN
  Administered 2023-09-25 – 2023-09-26 (×2): 25 mg via ORAL
  Filled 2023-09-25 (×2): qty 1

## 2023-09-25 MED ORDER — LORAZEPAM 1 MG PO TABS
1.0000 mg | ORAL_TABLET | Freq: Once | ORAL | Status: AC
  Administered 2023-09-25: 1 mg via ORAL
  Filled 2023-09-25: qty 1

## 2023-09-25 NOTE — ED Notes (Signed)
The patient became upset after talking with his grandparents on the phone. He requested no visitors. He also hit the walls while upset. He took oral medications for anxiety.

## 2023-09-25 NOTE — ED Notes (Signed)
The patient feels less anxious after medications.

## 2023-09-25 NOTE — Progress Notes (Signed)
   09/25/23 2015  Psych Admission Type (Psych Patients Only)  Admission Status Involuntary  Psychosocial Assessment  Patient Complaints Self-harm behaviors  Eye Contact Brief  Facial Expression Anxious  Affect Frightened  Speech Logical/coherent  Interaction Assertive  Motor Activity Slow  Appearance/Hygiene In scrubs  Behavior Characteristics Cooperative  Mood Anxious  Thought Process  Coherency WDL  Content Paranoia  Delusions WDL  Perception Hallucinations  Hallucination Visual  Judgment Impaired  Confusion None  Danger to Self  Current suicidal ideation? Denies  Danger to Others  Danger to Others None reported or observed

## 2023-09-25 NOTE — ED Notes (Signed)
Brother left contact information.  Randon Goldsmith - [phone]

## 2023-09-25 NOTE — ED Notes (Signed)
Patient provided hygiene products, new scrubs, new socks and escorted to the bathroom. Patient performed hygiene interventions independently. Sitter remained outside bathroom door during this time. New linen placed on patients bed. Patient escorted back to room. Patient calm and cooperative. No further needs expressed at this time.

## 2023-09-25 NOTE — Progress Notes (Signed)
Admission Note: Patient is a 27 year old male admitted to the unit for self harm thoughts, anxiety, depression and medication noncompliance.  Patient has history of self-injurious behaviors.  Patient is on the spectrum (Autistic).  Patient currently denies SI.  Stated, "I came to the hospital to fill my prescription."  Preoccupied with seeing a provider and discharge plan.  Educated patient about admission process and discharge plan.  Patient is alert and oriented x 4.  Patient presents with anxious affect and paranoid behaviors.  Appears frightened and tearful during assessment.  Admission plan of care reviewed, consent signed.  Skin and personal belongings completed.  Skin is dry and intact.  No contraband found.  Patient oriented to the unit, staff and room.  Routine safety checks initiated.  Patient is safe on the unit at this time.

## 2023-09-25 NOTE — Plan of Care (Signed)
  Problem: Activity: Goal: Sleeping patterns will improve Outcome: Progressing   Problem: Coping: Goal: Ability to demonstrate self-control will improve Outcome: Progressing   Problem: Safety: Goal: Periods of time without injury will increase Outcome: Progressing   

## 2023-09-25 NOTE — ED Notes (Signed)
Per pt his cell phone is locked up in his car. Pt has 2 bags with labels over techs head

## 2023-09-25 NOTE — ED Notes (Signed)
Pt has a visitor. Security used wand on visitor and left belongings with security.

## 2023-09-25 NOTE — ED Notes (Signed)
Patient  banging on the wall Security called /standing by door

## 2023-09-25 NOTE — ED Notes (Signed)
Patient updated on plan of care. Patient given water per request. No other needs expressed at this time. Patient agreeable to plan, calm and cooperative. Sitter remains present at bedside.

## 2023-09-25 NOTE — Group Note (Signed)
Date:  09/25/2023 Time:  9:23 PM  Group Topic/Focus:  Wrap-Up Group:   The focus of this group is to help patients review their daily goal of treatment and discuss progress on daily workbooks.    Participation Level:  Did Not Attend   Scot Dock 09/25/2023, 9:23 PM

## 2023-09-25 NOTE — ED Notes (Signed)
At this time patient denies suicide ideation

## 2023-09-25 NOTE — Tx Team (Signed)
Initial Treatment Plan 09/25/2023 6:26 PM Macauley Mossberg ZHY:[id number]    PATIENT STRESSORS: Health problems   Medication change or noncompliance     PATIENT STRENGTHS: Motivation for treatment/growth  Supportive family/friends    PATIENT IDENTIFIED PROBLEMS: "To feel my prescription"  Suicidal ideation  Anxiety  Depression  Ineffective coping skills             DISCHARGE CRITERIA:  Adequate post-discharge living arrangements Motivation to continue treatment in a less acute level of care  PRELIMINARY DISCHARGE PLAN: Attend aftercare/continuing care group Outpatient therapy Return to previous living arrangement  PATIENT/FAMILY INVOLVEMENT: This treatment plan has been presented to and reviewed with the patient, Terance Pomplun, and/or family member.  The patient and family have been given the opportunity to ask questions and make suggestions.  Clarene Critchley, RN 09/25/2023, 6:26 PM

## 2023-09-25 NOTE — ED Notes (Signed)
Patient is very agitated/ stated no visitors/ RN notified

## 2023-09-25 NOTE — Progress Notes (Signed)
Pt has been accepted to [facility] TODAY 09/25/2023. Bed assignment: 405-2  Pt meets inpatient criteria per Jerrilyn Cairo, NP  Attending Physician will be Phineas Inches, MD  Report can be called to: - Adult unit: [phone]  Pt can arrive after 3 PM  Care Team Notified: [facility] [place], RN, Sandria Bales, RN, Hayden Pedro, RN, and Joaquin Courts, NP  Cathie Beams, MSW, LCSW  09/25/2023 12:58 PM

## 2023-09-26 DIAGNOSIS — F332 Major depressive disorder, recurrent severe without psychotic features: Secondary | ICD-10-CM | POA: Diagnosis not present

## 2023-09-26 DIAGNOSIS — F840 Autistic disorder: Secondary | ICD-10-CM | POA: Insufficient documentation

## 2023-09-26 DIAGNOSIS — F4310 Post-traumatic stress disorder, unspecified: Secondary | ICD-10-CM | POA: Insufficient documentation

## 2023-09-26 DIAGNOSIS — F411 Generalized anxiety disorder: Secondary | ICD-10-CM | POA: Insufficient documentation

## 2023-09-26 MED ORDER — FLUOXETINE HCL 20 MG PO CAPS
20.0000 mg | ORAL_CAPSULE | Freq: Every day | ORAL | Status: DC
  Administered 2023-09-27: 20 mg via ORAL
  Filled 2023-09-26 (×2): qty 1

## 2023-09-26 MED ORDER — FLUOXETINE HCL 10 MG PO CAPS
10.0000 mg | ORAL_CAPSULE | Freq: Once | ORAL | Status: AC
  Administered 2023-09-26: 10 mg via ORAL
  Filled 2023-09-26 (×2): qty 1

## 2023-09-26 MED ORDER — PROPRANOLOL HCL 10 MG PO TABS: 10.0000 mg | ORAL_TABLET | Freq: Two times a day (BID) | ORAL | Status: DC | PRN

## 2023-09-26 NOTE — [facility] Suicide Risk Assessment (Signed)
[facility] Admission Suicide Risk Assessment   Nursing information obtained from:  Patient Demographic factors:  Male, Adolescent or young adult Current Mental Status:  Self-harm behaviors Loss Factors:  Financial problems / change in socioeconomic status Historical Factors:  Impulsivity, Prior suicide attempts Risk Reduction Factors:  Living with another person, especially a relative  Total Time spent with patient: 45 minutes Principal Problem: MDD (major depressive disorder), recurrent severe, without psychosis (HCC) Diagnosis:  Principal Problem:   MDD (major depressive disorder), recurrent severe, without psychosis (HCC) Active Problems:   GAD (generalized anxiety disorder)   Autism disorder   PTSD (post-traumatic stress disorder)  Subjective Data:  Xavier Olson is a 27 yr old male who presented to [facility] ED on 10/3 due to concerns of SI during PCP appointment, he was admitted to [facility] on 10/5. PPHx is significant for Depression, Anxiety, and Autism Spectrum Disorder, and Self Injurious Behavior (Cutting-last 07/2023), and no history of Suicide Attempts or Prior Psychiatric Hospitalizations.    He reports that he had not been on his medication for approximately 4 months due to losing his job and insurance that went with it.  He reports that recently he got Medicaid and so was able to schedule an appointment with his PCP.  He reports that he went to his PCP to get refills of his medications.  He reports that while there he discussed that he had an episode of self-injurious behavior-cutting about 2 months prior.  He reports that his doctor wanted him to go to the ED to be assessed.  He reports that because of the noise he environment it was very overstimulating to him and that is what caused him to act out.  He reports that he is only self-harm and in the past when at his jobs surrounded by others because he was overwhelmed but never did it elsewhere.   He reports past psychiatric history  significant for Depression, Anxiety, and Autism Spectrum Disorder.  He reports no history of suicide attempts.  He reports a history of self-injurious behavior-cutting last 07/2023.  He reports no history of prior psychiatric hospitalizations.  He reports no significant past medical history.  He reports no significant past surgical history.  He reports no history of head trauma.  He reports no history of seizures.  He reports NKDA.   He reports current lives in apartment with his brother.  He reports he currently works at the [employer].  He reports he graduated high school.  He reports he plans to go to college to study [employer].  He reports no alcohol use.  He reports smoking the occasional cigar for celebrations.  He reports no illicit substance use.  He reports no current legal issues.  He reports no access to firearms.   He reports that he is wanting to restart his medications.  He reports he is also interested in starting therapy and medication management with a psychiatrist.  Discussed with him that since he was willing to restart medications and continue with outpatient follow-up if safety planning could be done with his family we could consider discharge tomorrow and he was agreeable with this.  He reports that he did not know his brother's phone number off the top of his head but he did have his mother's number and that they can both be contacted and talked to for collateral.  He reports no SI, HI, or AVH.  He reports a mild headache but otherwise reports no other concerns at present.  Called patient's mother, Xavier Olson, [phone].  She reports that patient has always had trouble expressing himself due to his autism.  She reports that when he is unable to get across what he means he gets frustrated which compounds everything.  She reports that if the patient is discharged tomorrow she would have no safety concerns about this.  She was able to provide the number to patient's brother.  She  reports no other concerns at present.     Called patient's brother, Xavier Olson, [phone].  He reports that if the patient were discharged tomorrow he would have no safety concerns at all.  Discussed safety planning with him-removing or securing all medications, knives and sharp objects, and confirming that there are no firearms in the house.  He reports that while he did have a gun this was removed over a year ago.  He reports that he will secure all sharp objects.  He reports there are no medications in the house and that he will control patient's medications at discharge.  He reports no other concerns present.   Continued Clinical Symptoms:    The "Alcohol Use Disorders Identification Test", Guidelines for Use in Primary Care, Second Edition.  World Science writer [facility]). Score between 0-7:  no or low risk or alcohol related problems. Score between 8-15:  moderate risk of alcohol related problems. Score between 16-19:  high risk of alcohol related problems. Score 20 or above:  warrants further diagnostic evaluation for alcohol dependence and treatment.   CLINICAL FACTORS:   Depression:   Severe More than one psychiatric diagnosis Previous Psychiatric Diagnoses and Treatments Medical Diagnoses and Treatments/Surgeries   Musculoskeletal: Strength & Muscle Tone: within normal limits Gait & Station: normal Patient leans: N/A  Psychiatric Specialty Exam:  Presentation  General Appearance: Appropriate for Environment; Casual  Eye Contact:Fair  Speech:Clear and Coherent; Normal Rate  Speech Volume:Normal  Handedness:No data recorded  Mood and Affect  Mood:Dysphoric  Affect:Congruent   Thought Process  Thought Processes:Coherent; Goal Directed  Descriptions of Associations:Intact  Orientation:Full (Time, Place and Person)  Thought Content:Logical; WDL  History of Schizophrenia/Schizoaffective disorder:No  Duration of Psychotic  Symptoms:N/A  Hallucinations:Hallucinations: None  Ideas of Reference:None  Suicidal Thoughts:Suicidal Thoughts: No  Homicidal Thoughts:Homicidal Thoughts: No   Sensorium  Memory:Immediate Fair; Recent Fair  Judgment:Fair  Insight:Fair   Executive Functions  Concentration:Good  Attention Span:Good  Recall:Good  Fund of Knowledge:Good  Language:Good   Psychomotor Activity  Psychomotor Activity:Psychomotor Activity: Normal   Assets  Assets:Communication Skills; Desire for Improvement; Resilience; Social Support; Physical Health; Housing   Sleep  Sleep:Sleep: Fair    Physical Exam: Physical Exam Vitals and nursing note reviewed.  Constitutional:      General: He is not in acute distress.    Appearance: Normal appearance. He is normal weight. He is not ill-appearing or toxic-appearing.  HENT:     Head: Normocephalic and atraumatic.  Pulmonary:     Effort: Pulmonary effort is normal.  Musculoskeletal:        General: Normal range of motion.  Neurological:     General: No focal deficit present.     Mental Status: He is alert.    Review of Systems  Respiratory:  Negative for cough and shortness of breath.   Cardiovascular:  Negative for chest pain.  Gastrointestinal:  Negative for abdominal pain, constipation, diarrhea, nausea and vomiting.  Neurological:  Negative for dizziness, weakness and headaches.  Psychiatric/Behavioral:  Positive for depression. Negative for hallucinations and suicidal ideas. The patient is  nervous/anxious.    Blood pressure 128/82, pulse 97, temperature 97.9 F (36.6 C), temperature source Oral, resp. rate 18, height 5\' 11"  (1.803 m), weight 111.6 kg, SpO2 98%. Body mass index is 34.31 kg/m.   COGNITIVE FEATURES THAT CONTRIBUTE TO RISK:  Thought constriction (tunnel vision)    SUICIDE RISK:   Moderate:  Frequent suicidal ideation with limited intensity, and duration, some specificity in terms of plans, no associated  intent, good self-control, limited dysphoria/symptomatology, some risk factors present, and identifiable protective factors, including available and accessible social support.  PLAN OF CARE:  Xavier Olson is a 27 yr old male who presented to [facility] ED on 10/3 due to concerns of SI during PCP appointment, he was admitted to [facility] on 10/5.  PPHx is significant for Depression, Anxiety, and Autism Spectrum Disorder, and Self Injurious Behavior (Cutting-last 07/2023), and no history of Suicide Attempts or Prior Psychiatric Hospitalizations.     Xavier Olson has a history of depression and anxiety but had to stop his medications due to loss of his job and insurance.  His outburst in the ED does seem to be due to the overwhelming environment and his Autism.  He was reporting seeing shadows and hearing voices but it seems to be PTSD based and not psychosis.  We will restart his Prozac.  As his mother and brother do not have any safety concerns we will plan for discharge tomorrow.  We will continue to monitor.      MDD, Recurrent, Severe, w/out Psychosis  GAD  PTSD: -Start Prozac 10 mg daily then increase to 20 mg tomorrow for depression and anxiety -Start Propanolol 10 mg BID PRN anxiety -Continue Agitation Protocol: Haldol/Ativan/Benadryl     -Continue PRN's: Tylenol, Maalox, Atarax, Milk of Magnesia, Trazodone  I certify that inpatient services furnished can reasonably be expected to improve the patient's condition.   Lauro Franklin, MD 09/26/2023, 2:20 PM

## 2023-09-26 NOTE — Progress Notes (Signed)
Adult Psychoeducational Group Note  Date:  09/26/2023 Time:  9:50 AM  Group Topic/Focus:  Goals Group:   The focus of this group is to help patients establish daily goals to achieve during treatment and discuss how the patient can incorporate goal setting into their daily lives to aide in recovery.  Participation Level:  Did Not Attend  Participation Quality:  N/A  Affect:  Na  Cognitive:  NA  Insight: None  Engagement in Group:  N/A  Modes of Intervention:  N/A  Additional Comments:  N/A  Octavio Manns 09/26/2023, 9:50 AM
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
## 2023-09-26 NOTE — H&P (Signed)
Psychiatric Admission Assessment Adult  Patient Identification: Xavier Olson MRN:  [id number] Date of Evaluation:  09/26/2023 Chief Complaint:  MDD (major depressive disorder), single episode, severe with psychotic features (HCC) [F32.3] Principal Diagnosis: MDD (major depressive disorder), recurrent severe, without psychosis (HCC) Diagnosis:  Principal Problem:   MDD (major depressive disorder), recurrent severe, without psychosis (HCC) Active Problems:   GAD (generalized anxiety disorder)   Autism disorder   PTSD (post-traumatic stress disorder)  History of Present Illness:  Mihcael Olson is a 27 yr old male who presented to [facility] ED on 10/3 due to concerns of SI during PCP appointment, he was admitted to [facility] on 10/5.  PPHx is significant for Depression, Anxiety, and Autism Spectrum Disorder, and Self Injurious Behavior (Cutting-last 07/2023), and no history of Suicide Attempts or Prior Psychiatric Hospitalizations.  He reports that he had not been on his medication for approximately 4 months due to losing his job and insurance that went with it.  He reports that recently he got Medicaid and so was able to schedule an appointment with his PCP.  He reports that he went to his PCP to get refills of his medications.  He reports that while there he discussed that he had an episode of self-injurious behavior-cutting about 2 months prior.  He reports that his doctor wanted him to go to the ED to be assessed.  He reports that because of the noise he environment it was very overstimulating to him and that is what caused him to act out.  He reports that he is only self-harm and in the past when at his jobs surrounded by others because he was overwhelmed but never did it elsewhere.  He reports past psychiatric history significant for Depression, Anxiety, and Autism Spectrum Disorder.  He reports no history of suicide attempts.  He reports a history of self-injurious behavior-cutting last  07/2023.  He reports no history of prior psychiatric hospitalizations.  He reports no significant past medical history.  He reports no significant past surgical history.  He reports no history of head trauma.  He reports no history of seizures.  He reports NKDA.  He reports current lives in apartment with his brother.  He reports he currently works at the [employer].  He reports he graduated high school.  He reports he plans to go to college to study [employer].  He reports no alcohol use.  He reports smoking the occasional cigar for celebrations.  He reports no illicit substance use.  He reports no current legal issues.  He reports no access to firearms.  He reports that he is wanting to restart his medications.  He reports he is also interested in starting therapy and medication management with a psychiatrist.  Discussed with him that since he was willing to restart medications and continue with outpatient follow-up if safety planning could be done with his family we could consider discharge tomorrow and he was agreeable with this.  He reports that he did not know his brother's phone number off the top of his head but he did have his mother's number and that they can both be contacted and talked to for collateral.  He reports no SI, HI, or AVH.  He reports a mild headache but otherwise reports no other concerns at present.   Called patient's mother, Xavier Olson, [phone].  She reports that patient has always had trouble expressing himself due to his autism.  She reports that when he is unable to get across what he means  he gets frustrated which compounds everything.  She reports that if the patient is discharged tomorrow she would have no safety concerns about this.  She was able to provide the number to patient's brother.  She reports no other concerns at present.   Called patient's brother, Xavier Olson, [phone].  He reports that if the patient were discharged tomorrow he would have no safety  concerns at all.  Discussed safety planning with him-removing or securing all medications, knives and sharp objects, and confirming that there are no firearms in the house.  He reports that while he did have a gun this was removed over a year ago.  He reports that he will secure all sharp objects.  He reports there are no medications in the house and that he will control patient's medications at discharge.  He reports no other concerns present.  Associated Signs/Symptoms: Depression Symptoms:  depressed mood, insomnia, fatigue, anxiety, loss of energy/fatigue, disturbed sleep, (Hypo) Manic Symptoms:   Reports None Anxiety Symptoms:  Excessive Worry, Psychotic Symptoms:   Reports None PTSD Symptoms: Re-experiencing:  Flashbacks Intrusive Thoughts Hypervigilance:  Yes Hyperarousal:  None Avoidance:  Decreased Interest/Participation Total Time spent with patient: 45 minutes  Past Psychiatric History: Depression, Anxiety, and Autism Spectrum Disorder, and Self Injurious Behavior (Cutting-last 07/2023), and no history of Suicide Attempts or Prior Psychiatric Hospitalizations.  Is the patient at risk to self? No.  Has the patient been a risk to self in the past 6 months? Yes.    Has the patient been a risk to self within the distant past? Yes.    Is the patient a risk to others? No.  Has the patient been a risk to others in the past 6 months? No.  Has the patient been a risk to others within the distant past? No.   [place] Scale:  Flowsheet Row [facility] (Current) [facility] from [facility]24/2023 in [facility] at [facility]  C-SSRS RISK CATEGORY High Risk High Risk        Prior Inpatient Therapy: No. If yes, describe N/A  Prior Outpatient Therapy: Yes.   If yes, describe around age 89   Alcohol Screening: Patient refused Alcohol Screening Tool: Yes Alcohol Brief Interventions/Follow-up: Patient  Refused Substance Abuse History in the last 12 months:  No. Consequences of Substance Abuse: NA Previous Psychotropic Medications: Yes  Prozac, Propanolol, Hydroxyzine Psychological Evaluations: No  Past Medical History:  Past Medical History:  Diagnosis Date   Irregular heart beat    MRSA (methicillin resistant staph aureus) culture positive    Nystagmus     Past Surgical History:  Procedure Laterality Date   EYE SURGERY  age 52   Family History:  Family History  Problem Relation Age of Onset   Thyroid disease Maternal Grandmother    Heart Problems Maternal Grandfather    Thyroid disease Maternal Grandfather    Thyroid disease Paternal Grandmother    Heart disease Paternal Grandfather    Thyroid disease Paternal Grandfather    Family Psychiatric  History:  Father- EtOH Abuse No Known Substance Abuse or Suicides  Tobacco Screening:  Social History   Tobacco Use  Smoking Status Some Days   Types: Cigars  Smokeless Tobacco Never  Tobacco Comments   2 cigars per month    BH Tobacco Counseling     Are you interested in Tobacco Cessation Medications?  No, patient refused Counseled patient on smoking cessation:  Refused/Declined practical counseling Reason  Tobacco Screening Not Completed: No value filed.       Social History:  Social History   Substance and Sexual Activity  Alcohol Use No   Alcohol/week: 0.0 standard drinks of alcohol     Social History   Substance and Sexual Activity  Drug Use No    Additional Social History: Marital status: Single Are you sexually active?: No What is your sexual orientation?: heterosexual Has your sexual activity been affected by drugs, alcohol, medication, or emotional stress?: none Does patient have children?: No                         Allergies:  No Known Allergies Lab Results:  Results for orders placed or performed during the hospital encounter of 09/24/23 (from the past 48 hour(s))  Rapid urine drug  screen (hospital performed)     Status: None   Collection Time: 09/24/23  2:38 PM  Result Value Ref Range   Opiates NONE DETECTED NONE DETECTED   Cocaine NONE DETECTED NONE DETECTED   Benzodiazepines NONE DETECTED NONE DETECTED   Amphetamines NONE DETECTED NONE DETECTED   Tetrahydrocannabinol NONE DETECTED NONE DETECTED   Barbiturates NONE DETECTED NONE DETECTED    Comment: (NOTE) DRUG SCREEN FOR MEDICAL PURPOSES ONLY.  IF CONFIRMATION IS NEEDED FOR ANY PURPOSE, NOTIFY LAB WITHIN 5 DAYS.  LOWEST DETECTABLE LIMITS FOR URINE DRUG SCREEN Drug Class                     Cutoff (ng/mL) Amphetamine and metabolites    1000 Barbiturate and metabolites    200 Benzodiazepine                 200 Opiates and metabolites        300 Cocaine and metabolites        300 THC                            50 Performed at [facility], [address] [place], [place] [address]   Comprehensive metabolic panel     Status: Abnormal   Collection Time: 09/24/23  4:31 PM  Result Value Ref Range   Sodium 134 (L) 135 - 145 mmol/L   Potassium 3.6 3.5 - 5.1 mmol/L   Chloride 102 98 - 111 mmol/L   CO2 23 22 - 32 mmol/L   Glucose, Bld 91 70 - 99 mg/dL    Comment: Glucose reference range applies only to samples taken after fasting for at least 8 hours.   BUN 11 6 - 20 mg/dL   Creatinine, Ser 9.14 0.61 - 1.24 mg/dL   Calcium 9.1 8.9 - 78.2 mg/dL   Total Protein 7.8 6.5 - 8.1 g/dL   Albumin 4.5 3.5 - 5.0 g/dL   AST 21 15 - 41 U/L   ALT 19 0 - 44 U/L   Alkaline Phosphatase 64 38 - 126 U/L   Total Bilirubin 0.7 0.3 - 1.2 mg/dL   GFR, Estimated >95 >62 mL/min    Comment: (NOTE) Calculated using the CKD-EPI Creatinine Equation (2021)    Anion gap 9 5 - 15    Comment: Performed at [facility], [address] [place], [place] [address]  Ethanol     Status: None   Collection Time: 09/24/23  4:31 PM  Result Value Ref Range   Alcohol, Ethyl (B) <10 <10 mg/dL  Comment: (NOTE) Lowest  detectable limit for serum alcohol is 10 mg/dL.  For medical purposes only. Performed at [facility], [address] [place], [place] [address]   Salicylate level     Status: Abnormal   Collection Time: 09/24/23  4:31 PM  Result Value Ref Range   Salicylate Lvl <7.0 (L) 7.0 - 30.0 mg/dL    Comment: Performed at [facility], [address] [place], [place] [address]  Acetaminophen level     Status: Abnormal   Collection Time: 09/24/23  4:31 PM  Result Value Ref Range   Acetaminophen (Tylenol), Serum <10 (L) 10 - 30 ug/mL    Comment: (NOTE) Therapeutic concentrations vary significantly. A range of 10-30 ug/mL  may be an effective concentration for many patients. However, some  are best treated at concentrations outside of this range. Acetaminophen concentrations >150 ug/mL at 4 hours after ingestion  and >50 ug/mL at 12 hours after ingestion are often associated with  toxic reactions.  Performed at [facility], [address] [place], [place] [address]   cbc     Status: None   Collection Time: 09/24/23  4:31 PM  Result Value Ref Range   WBC 6.4 4.0 - 10.5 K/uL   RBC 5.38 4.22 - 5.81 MIL/uL   Hemoglobin 16.6 13.0 - 17.0 g/dL   HCT 95.1 88.4 - 16.6 %   MCV 87.7 80.0 - 100.0 fL   MCH 30.9 26.0 - 34.0 pg   MCHC 35.2 30.0 - 36.0 g/dL   RDW 06.3 01.6 - 01.0 %   Platelets 251 150 - 400 K/uL   nRBC 0.0 0.0 - 0.2 %    Comment: Performed at [facility], [address] [place], [place] [address]    Blood Alcohol level:  Lab Results  Component Value Date   [facility] <10 09/24/2023    Metabolic Disorder Labs:  Lab Results  Component Value Date   HGBA1C 5.5 02/11/2021   No results found for: "PROLACTIN" Lab Results  Component Value Date   CHOL 172 02/11/2021   TRIG 108.0 02/11/2021   HDL 53.60 02/11/2021   CHOLHDL 3 02/11/2021   VLDL 21.6 02/11/2021   LDLCALC 97 02/11/2021    Current Medications: Current  Facility-Administered Medications  Medication Dose Route Frequency Provider Last Rate Last Admin   acetaminophen (TYLENOL) tablet 650 mg  650 mg Oral Q6H PRN Bing Neighbors, NP   650 mg at 09/26/23 0817   alum & mag hydroxide-simeth (MAALOX/MYLANTA) 200-200-20 MG/5ML suspension 30 mL  30 mL Oral Q4H PRN Bing Neighbors, NP       diphenhydrAMINE (BENADRYL) capsule 50 mg  50 mg Oral TID PRN Bing Neighbors, NP       Or   diphenhydrAMINE (BENADRYL) injection 50 mg  50 mg Intramuscular TID PRN Bing Neighbors, NP       [START ON 09/27/2023] FLUoxetine (PROZAC) capsule 20 mg  20 mg Oral Daily Raif Chachere, Mardelle Matte, MD       haloperidol (HALDOL) tablet 5 mg  5 mg Oral TID PRN Bing Neighbors, NP       Or   haloperidol lactate (HALDOL) injection 5 mg  5 mg Intramuscular TID PRN Bing Neighbors, NP       hydrOXYzine (ATARAX) tablet 25 mg  25 mg Oral TID PRN Bing Neighbors, NP   25 mg at 09/25/23 2040   LORazepam (ATIVAN)  tablet 2 mg  2 mg Oral TID PRN Bing Neighbors, NP       Or   LORazepam (ATIVAN) injection 2 mg  2 mg Intramuscular TID PRN Bing Neighbors, NP       magnesium hydroxide (MILK OF MAGNESIA) suspension 30 mL  30 mL Oral Daily PRN Bing Neighbors, NP   30 mL at 09/26/23 0817   propranolol (INDERAL) tablet 10 mg  10 mg Oral BID PRN Lauro Franklin, MD       traZODone (DESYREL) tablet 50 mg  50 mg Oral QHS PRN Bing Neighbors, NP   50 mg at 09/25/23 2040   PTA Medications: Medications Prior to Admission  Medication Sig Dispense Refill Last Dose   FLUoxetine (PROZAC) 20 MG tablet Take 1 tablet (20 mg total) by mouth daily. (Patient not taking: Reported on 09/24/2023) 30 tablet 0    hydrOXYzine (ATARAX) 25 MG tablet TAKE 1 TABLET (25 MG TOTAL) BY MOUTH DAILY AS NEEDED. FOR ANXIETY (Patient not taking: Reported on 09/24/2023) 90 tablet 0    propranolol (INDERAL) 10 MG tablet TAKE 1 TABLET BY MOUTH TWICE DAILY AS NEEDED FOR SHAKINESS OR ANXIETY (Patient  not taking: Reported on 09/24/2023) 180 tablet 0     Musculoskeletal: Strength & Muscle Tone: within normal limits Gait & Station: normal Patient leans: N/A            Psychiatric Specialty Exam:  Presentation  General Appearance: Appropriate for Environment; Casual  Eye Contact:Fair  Speech:Clear and Coherent; Normal Rate  Speech Volume:Normal  Handedness:No data recorded  Mood and Affect  Mood:Dysphoric  Affect:Congruent   Thought Process  Thought Processes:Coherent; Goal Directed  Duration of Psychotic Symptoms:N/A Past Diagnosis of Schizophrenia or Psychoactive disorder: No  Descriptions of Associations:Intact  Orientation:Full (Time, Place and Person)  Thought Content:Logical; WDL  Hallucinations:Hallucinations: None  Ideas of Reference:None  Suicidal Thoughts:Suicidal Thoughts: No  Homicidal Thoughts:Homicidal Thoughts: No   Sensorium  Memory:Immediate Fair; Recent Fair  Judgment:Fair  Insight:Fair   Executive Functions  Concentration:Good  Attention Span:Good  Recall:Good  Fund of Knowledge:Good  Language:Good   Psychomotor Activity  Psychomotor Activity:Psychomotor Activity: Normal   Assets  Assets:Communication Skills; Desire for Improvement; Resilience; Social Support; Physical Health; Housing   Sleep  Sleep:Sleep: Fair    Physical Exam: Physical Exam Vitals and nursing note reviewed.  Constitutional:      General: He is not in acute distress.    Appearance: Normal appearance. He is normal weight. He is not ill-appearing or toxic-appearing.  HENT:     Head: Normocephalic and atraumatic.  Pulmonary:     Effort: Pulmonary effort is normal.  Musculoskeletal:        General: Normal range of motion.  Neurological:     General: No focal deficit present.     Mental Status: He is alert.    Review of Systems  Respiratory:  Negative for cough and shortness of breath.   Cardiovascular:  Negative for chest  pain.  Gastrointestinal:  Negative for abdominal pain, constipation, diarrhea, nausea and vomiting.  Neurological:  Negative for dizziness, weakness and headaches.  Psychiatric/Behavioral:  Positive for depression. Negative for hallucinations and suicidal ideas. The patient is nervous/anxious.    Blood pressure 128/82, pulse 97, temperature 97.9 F (36.6 C), temperature source Oral, resp. rate 18, height 5\' 11"  (1.803 m), weight 111.6 kg, SpO2 98%. Body mass index is 34.31 kg/m.  Treatment Plan Summary: Daily contact with patient to assess and evaluate symptoms and  progress in treatment and Medication management  Xavier Olson is a 27 yr old male who presented to [facility] ED on 10/3 due to concerns of SI during PCP appointment, he was admitted to [facility] on 10/5.  PPHx is significant for Depression, Anxiety, and Autism Spectrum Disorder, and Self Injurious Behavior (Cutting-last 07/2023), and no history of Suicide Attempts or Prior Psychiatric Hospitalizations.   Bryant has a history of depression and anxiety but had to stop his medications due to loss of his job and insurance.  His outburst in the ED does seem to be due to the overwhelming environment and his Autism.  He was reporting seeing shadows and hearing voices but it seems to be PTSD based and not psychosis.  We will restart his Prozac.  As his mother and brother do not have any safety concerns we will plan for discharge tomorrow.  We will continue to monitor.    MDD, Recurrent, Severe, w/out Psychosis  GAD  PTSD: -Start Prozac 10 mg daily then increase to 20 mg tomorrow for depression and anxiety -Start Propanolol 10 mg BID PRN anxiety -Continue Agitation Protocol: Haldol/Ativan/Benadryl   -Continue PRN's: Tylenol, Maalox, Atarax, Milk of Magnesia, Trazodone    Observation Level/Precautions:  15 minute checks  Laboratory:  CMP: WNL except Na: 134, CBC: WNL, EtOH/Acetaminophen/Salicylate: WNL,  UDS: Neg  Psychotherapy:     Medications:  Prozac, Propanolol  Consultations:    Discharge Concerns:    Estimated LOS: 1-2 days  Other:     Physician Treatment Plan for Primary Diagnosis: MDD (major depressive disorder), recurrent severe, without psychosis (HCC) Long Term Goal(s): Improvement in symptoms so as ready for discharge  Short Term Goals: Ability to identify changes in lifestyle to reduce recurrence of condition will improve, Ability to verbalize feelings will improve, Ability to disclose and discuss suicidal ideas, Ability to identify and develop effective coping behaviors will improve, and Ability to maintain clinical measurements within normal limits will improve  Physician Treatment Plan for Secondary Diagnosis: Principal Problem:   MDD (major depressive disorder), recurrent severe, without psychosis (HCC) Active Problems:   GAD (generalized anxiety disorder)   Autism disorder   PTSD (post-traumatic stress disorder)  Long Term Goal(s): Improvement in symptoms so as ready for discharge  Short Term Goals: Ability to identify changes in lifestyle to reduce recurrence of condition will improve, Ability to verbalize feelings will improve, Ability to disclose and discuss suicidal ideas, Ability to identify and develop effective coping behaviors will improve, and Ability to maintain clinical measurements within normal limits will improve  I certify that inpatient services furnished can reasonably be expected to improve the patient's condition.    Lauro Franklin, MD 10/5/20242:19 PM

## 2023-09-26 NOTE — Group Note (Signed)
Date:  09/26/2023 Time:  8:57 PM  Group Topic/Focus:  Wrap-Up Group:   The focus of this group is to help patients review their daily goal of treatment and discuss progress on daily workbooks.    Participation Level:  Active  Participation Quality:  Appropriate  Affect:  Appropriate  Cognitive:  Appropriate  Insight: Appropriate  Engagement in Group:  Engaged  Modes of Intervention:  Education and Exploration  Additional Comments:  Patient attended and participated in group tonight. He reports that is was a good day for him because his family visited. He also enjoyed lunch.  Lita Mains [facility] 09/26/2023, 8:57 PM

## 2023-09-26 NOTE — Progress Notes (Signed)
Dar Note: Patient presents with calm affect and mood.  Denies suicidal thoughts, auditory and visual hallucinations.  Medication given as prescribed.  Patient visible in milieu interacting well with staff.  Routine safety checks maintained.  Support and encouragement offered as needed.  Patient is safe on the unit.

## 2023-09-26 NOTE — Plan of Care (Signed)
  Problem: Education: Goal: Knowledge of [facility] Education information/materials will improve Outcome: Progressing Goal: Emotional status will improve Outcome: Progressing Goal: Mental status will improve Outcome: Progressing Goal: Verbalization of understanding the information provided will improve Outcome: Progressing   Problem: Activity: Goal: Interest or engagement in activities will improve Outcome: Progressing Goal: Sleeping patterns will improve Outcome: Progressing   Problem: Coping: Goal: Ability to verbalize frustrations and anger appropriately will improve Outcome: Progressing Goal: Ability to demonstrate self-control will improve Outcome: Progressing   Problem: Health Behavior/Discharge Planning: Goal: Identification of resources available to assist in meeting health care needs will improve Outcome: Progressing Goal: Compliance with treatment plan for underlying cause of condition will improve Outcome: Progressing   Problem: Physical Regulation: Goal: Ability to maintain clinical measurements within normal limits will improve Outcome: Progressing   Problem: Safety: Goal: Periods of time without injury will increase Outcome: Progressing   Problem: Education: Goal: Ability to state activities that reduce stress will improve Outcome: Progressing   Problem: Coping: Goal: Ability to identify and develop effective coping behavior will improve Outcome: Progressing   Problem: Self-Concept: Goal: Ability to identify factors that promote anxiety will improve Outcome: Progressing Goal: Level of anxiety will decrease Outcome: Progressing Goal: Ability to modify response to factors that promote anxiety will improve Outcome: Progressing   Problem: Activity: Goal: Interest or engagement in leisure activities will improve Outcome: Progressing Goal: Imbalance in normal sleep/wake cycle will improve Outcome: Progressing   Problem: Coping: Goal: Coping  ability will improve Outcome: Progressing Goal: Will verbalize feelings Outcome: Progressing   Problem: Education: Goal: Knowledge of [facility] Education information/materials will improve Outcome: Progressing Goal: Emotional status will improve Outcome: Progressing Goal: Mental status will improve Outcome: Progressing Goal: Verbalization of understanding the information provided will improve Outcome: Progressing   Problem: Activity: Goal: Interest or engagement in activities will improve Outcome: Progressing Goal: Sleeping patterns will improve Outcome: Progressing   Problem: Coping: Goal: Ability to verbalize frustrations and anger appropriately will improve Outcome: Progressing Goal: Ability to demonstrate self-control will improve Outcome: Progressing   Problem: Health Behavior/Discharge Planning: Goal: Identification of resources available to assist in meeting health care needs will improve Outcome: Progressing Goal: Compliance with treatment plan for underlying cause of condition will improve Outcome: Progressing   Problem: Physical Regulation: Goal: Ability to maintain clinical measurements within normal limits will improve Outcome: Progressing   Problem: Safety: Goal: Periods of time without injury will increase Outcome: Progressing   Problem: Health Behavior/Discharge Planning: Goal: Identification of resources available to assist in meeting health care needs will improve Outcome: Progressing   Problem: Self-Concept: Goal: Will verbalize positive feelings about self Outcome: Progressing

## 2023-09-27 DIAGNOSIS — F332 Major depressive disorder, recurrent severe without psychotic features: Secondary | ICD-10-CM

## 2023-09-27 MED ORDER — HYDROXYZINE HCL 25 MG PO TABS: 25.0000 mg | ORAL_TABLET | Freq: Three times a day (TID) | ORAL | 0 refills | Status: DC | PRN

## 2023-09-27 MED ORDER — FLUOXETINE HCL 20 MG PO CAPS: 20.0000 mg | ORAL_CAPSULE | Freq: Every day | ORAL | 0 refills | Status: DC

## 2023-09-27 MED ORDER — PROPRANOLOL HCL 10 MG PO TABS: 10.0000 mg | ORAL_TABLET | Freq: Two times a day (BID) | ORAL | 0 refills | Status: DC | PRN

## 2023-09-27 MED ORDER — TRAZODONE HCL 50 MG PO TABS: 50.0000 mg | ORAL_TABLET | Freq: Every evening | ORAL | 0 refills | Status: DC | PRN

## 2023-09-27 NOTE — [facility] Suicide Risk Assessment (Signed)
[facility] INPATIENT:  Family/Significant Other Suicide Prevention Education  Suicide Prevention Education:  Education Completed; April Surrat,  (name of family member/significant other) has been identified by the patient as the family member/significant other with whom the patient will be residing, and identified as the person(s) who will aid the patient in the event of a mental health crisis (suicidal ideations/suicide attempt).  With written consent from the patient, the family member/significant other has been provided the following suicide prevention education, prior to the and/or following the discharge of the patient.  The suicide prevention education provided includes the following: Suicide risk factors Suicide prevention and interventions National Suicide Hotline telephone number [facility] assessment telephone number [facility] Emergency Assistance 911 [facility] and/or Residential Mobile Crisis Unit telephone number  Request made of family/significant other to: Remove weapons (e.g., guns, rifles, knives), all items previously/currently identified as safety concern.   Remove drugs/medications (over-the-counter, prescriptions, illicit drugs), all items previously/currently identified as a safety concern.  The family member/significant other verbalizes understanding of the suicide prevention education information provided.  The family member/significant other agrees to remove the items of safety concern listed above.  Verna Czech [place] 09/27/2023, 9:47 AM

## 2023-09-27 NOTE — Discharge Instructions (Addendum)
Please contact one of the following facilities to start medication management and therapy services:   [facility] at [facility] [address] ([address] [place])  [place], [place]  [address] Phone: [phone]  [facility]  [address] [place], [place] [address] Phone: [phone]  [facility]  [address]  [place], [place] [address]  [facility]  [address]  [place], [place] [address] Phone: [phone]  [facility] [address] [place], [place] [address] [phone]

## 2023-09-27 NOTE — Progress Notes (Signed)
  [facility] Adult Case Management Discharge Plan :  Will you be returning to the same living situation after discharge:  Yes,  Patient will be returning home with  At discharge, do you have transportation home?: Yes,  patient's mother will provide transport home Do you have the ability to pay for your medications: Yes,  patient has medicaid  Release of information consent forms completed and in the chart;  Patient's signature needed at discharge.  Patient to Follow up at: Please contact one of the following facilities to start medication management and therapy services:   [facility] at [facility] [address] ([address] [place])  [place], [place]  [address] Phone: [phone]  [facility]  [address] [place], [place] [address] Phone: [phone]  [facility]  [address]  [place], [place] [address]  [facility]  [address]  [place], [place] [address] Phone: [phone]   [facility] [address] [place], [place] [address] [phone]   Next level of care provider has access to [facility] Link:no  Safety Planning and Suicide Prevention discussed: Yes,  completed with patient's mother Xavier Olson     Has patient been referred to the Quitline?: Patient does not use tobacco/nicotine products  Patient has been referred for addiction treatment: No known substance use disorder.  [address] [place], [place] 09/27/2023, 9:24 AM

## 2023-09-27 NOTE — Plan of Care (Signed)

## 2023-09-27 NOTE — [facility] Suicide Risk Assessment (Signed)
[facility] Discharge Suicide Risk Assessment   Principal Problem: MDD (major depressive disorder), recurrent severe, without psychosis (HCC) Discharge Diagnoses: Principal Problem:   MDD (major depressive disorder), recurrent severe, without psychosis (HCC) Active Problems:   GAD (generalized anxiety disorder)   Autism disorder   PTSD (post-traumatic stress disorder)  During the patient's hospitalization, patient had extensive initial psychiatric evaluation, and follow-up psychiatric evaluations every day.  Psychiatric diagnoses provided upon initial assessment: MDD, Recurrent, Severe, w/out psychosis, GAD, PTSD, and Autism.  Patient's psychiatric medications were adjusted on admission: Restarted on Prozac, as needed Propanolol, and as needed Hydroxyzine.  During the hospitalization, other adjustments were made to the patient's psychiatric medication regimen: His Prozac was titrated to his prior home dose.  Gradually, patient started adjusting to milieu.   Patient's care was discussed during the interdisciplinary team meeting every day during the hospitalization.  The patient is not having side effects to prescribed psychiatric medication.  The patient reports their target psychiatric symptoms of depression and anxiety responded well to the psychiatric medications, and the patient reports overall benefit other psychiatric hospitalization. Supportive psychotherapy was provided to the patient. The patient also participated in regular group therapy while admitted.   Labs were reviewed with the patient, and abnormal results were discussed with the patient.  The patient denied having suicidal thoughts more than 48 hours prior to discharge.  Patient denies having homicidal thoughts.  Patient denies having auditory hallucinations.  Patient denies any visual hallucinations.  Patient denies having paranoid thoughts.  The patient is able to verbalize their individual safety plan to this provider.  It  is recommended to the patient to continue psychiatric medications as prescribed, after discharge from the hospital.    It is recommended to the patient to follow up with your outpatient psychiatric provider and PCP.  Discussed with the patient, the impact of alcohol, drugs, tobacco have been there overall psychiatric and medical wellbeing, and total abstinence from substance use was recommended the patient.  Total Time spent with patient: 30 minutes  Musculoskeletal: Strength & Muscle Tone: within normal limits Gait & Station: normal Patient leans: N/A  Psychiatric Specialty Exam  Presentation  General Appearance:  Appropriate for Environment; Casual  Eye Contact: Fair  Speech: Clear and Coherent; Normal Rate  Speech Volume: Normal  Handedness:No data recorded  Mood and Affect  Mood: -- ("good")  Duration of Depression Symptoms: Greater than two weeks  Affect: Appropriate; Congruent   Thought Process  Thought Processes: Coherent; Goal Directed  Descriptions of Associations:Intact  Orientation:Full (Time, Place and Person)  Thought Content:Logical; WDL  History of Schizophrenia/Schizoaffective disorder:No  Duration of Psychotic Symptoms:N/A  Hallucinations:Hallucinations: None  Ideas of Reference:None  Suicidal Thoughts:Suicidal Thoughts: No  Homicidal Thoughts:Homicidal Thoughts: No   Sensorium  Memory: Immediate Fair; Recent Fair  Judgment: Fair  Insight: Fair   Art therapist  Concentration: Good  Attention Span: Good  Recall: Good  Fund of Knowledge: Good  Language: Good   Psychomotor Activity  Psychomotor Activity: Psychomotor Activity: Normal   Assets  Assets: Communication Skills; Desire for Improvement; Resilience; Physical Health; Housing; Social Support   Sleep  Sleep: Sleep: Good Number of Hours of Sleep: 8.5   Physical Exam: Physical Exam Vitals and nursing note reviewed.  Constitutional:       General: He is not in acute distress.    Appearance: Normal appearance. He is normal weight. He is not ill-appearing or toxic-appearing.  HENT:     Head: Normocephalic and atraumatic.  Pulmonary:  Effort: Pulmonary effort is normal.  Musculoskeletal:        General: Normal range of motion.  Neurological:     General: No focal deficit present.     Mental Status: He is alert.    Review of Systems  Respiratory:  Negative for cough and shortness of breath.   Cardiovascular:  Negative for chest pain.  Gastrointestinal:  Negative for abdominal pain, constipation, diarrhea, nausea and vomiting.  Neurological:  Negative for dizziness, weakness and headaches.  Psychiatric/Behavioral:  Negative for depression, hallucinations and suicidal ideas. The patient is not nervous/anxious.    Blood pressure 112/76, pulse 85, temperature 97.7 F (36.5 C), temperature source Oral, resp. rate 18, height 5\' 11"  (1.803 m), weight 111.6 kg, SpO2 99%. Body mass index is 34.31 kg/m.  Mental Status Per Nursing Assessment::   On Admission:  Self-harm behaviors  Demographic Factors:  Male and Caucasian  Loss Factors: Financial problems/change in socioeconomic status  Historical Factors: Impulsivity and History of Self Harm  Risk Reduction Factors:   Sense of responsibility to family, Living with another person, especially a relative, and Positive social support  Continued Clinical Symptoms:  More than one psychiatric diagnosis  Cognitive Features That Contribute To Risk:  None    Suicide Risk:  Mild:  No Suicidal ideation.  There are no identifiable plans, no associated intent, mild dysphoria and related symptoms, good self-control (both objective and subjective assessment), few other risk factors, and identifiable protective factors, including available and accessible social support.  However, there remains some chronic risk due to his history of Suicide Attempts/Self Harm.    Plan Of  Care/Follow-up recommendations:  Activity: as tolerated  Diet: heart healthy  Other: -Follow-up with your outpatient psychiatric provider -instructions on appointment date, time, and address (location) are provided to you in discharge paperwork.  -Take your psychiatric medications as prescribed at discharge - instructions are provided to you in the discharge paperwork  -Follow-up with outpatient primary care doctor and other specialists -for management of chronic medical disease, including: Call outpatient psychiatrist and therapist offices on Monday morning to schedule post-hospitalization follow up appointments.  -Testing: Follow-up with outpatient provider for abnormal lab results: None  -Recommend abstinence from alcohol, tobacco, and other illicit drug use at discharge.   -If your psychiatric symptoms recur, worsen, or if you have side effects to your psychiatric medications, call your outpatient psychiatric provider, [phone] or go to the nearest emergency department.  -If suicidal thoughts recur, call your outpatient psychiatric provider, 911, [phone] or go to the nearest emergency department.   Lauro Franklin, MD 09/27/2023, 9:42 AM

## 2023-09-27 NOTE — Progress Notes (Signed)
   09/26/23 2200  Psych Admission Type (Psych Patients Only)  Admission Status Involuntary  Psychosocial Assessment  Patient Complaints Anxiety  Eye Contact Brief  Facial Expression Animated  Affect Appropriate to circumstance  Speech Logical/coherent  Interaction Assertive  Motor Activity Slow  Appearance/Hygiene Unremarkable  Behavior Characteristics Cooperative;Appropriate to situation  Mood Pleasant  Thought Process  Coherency WDL  Content Paranoia  Delusions None reported or observed  Perception Hallucinations  Hallucination Visual  Judgment Impaired  Confusion None  Danger to Self  Current suicidal ideation? Denies  Danger to Others  Danger to Others None reported or observed

## 2023-09-27 NOTE — Discharge Summary (Signed)
Physician Discharge Summary Note  Patient:  Xavier Olson is an 27 y.o., male MRN:  [id number] DOB:  1996-08-19 Patient phone:  [phone] (home)  Patient address:   [address] [place] [address],  Total Time spent with patient: 30 minutes  Date of Admission:  09/25/2023 Date of Discharge: 09/27/2023  Reason for Admission:   Xavier Olson is a 27 yr old male who presented to [facility] ED on 10/3 due to concerns of SI during PCP appointment, he was admitted to [facility] on 10/5.  PPHx is significant for Depression, Anxiety, and Autism Spectrum Disorder, and Self Injurious Behavior (Cutting-last 07/2023), and no history of Suicide Attempts or Prior Psychiatric Hospitalizations.   He reports that he had not been on his medication for approximately 4 months due to losing his job and insurance that went with it.  He reports that recently he got Medicaid and so was able to schedule an appointment with his PCP.  He reports that he went to his PCP to get refills of his medications.  He reports that while there he discussed that he had an episode of self-injurious behavior-cutting about 2 months prior.  He reports that his doctor wanted him to go to the ED to be assessed.  He reports that because of the noise he environment it was very overstimulating to him and that is what caused him to act out.  He reports that he is only self-harm and in the past when at his jobs surrounded by others because he was overwhelmed but never did it elsewhere.  Principal Problem: MDD (major depressive disorder), recurrent severe, without psychosis (HCC) Discharge Diagnoses: Principal Problem:   MDD (major depressive disorder), recurrent severe, without psychosis (HCC) Active Problems:   GAD (generalized anxiety disorder)   Autism disorder   PTSD (post-traumatic stress disorder)   Past Psychiatric History: Depression, Anxiety, and Autism Spectrum Disorder, and Self Injurious Behavior (Cutting-last  07/2023), and no history of Suicide Attempts or Prior Psychiatric Hospitalizations.   Past Medical History:  Past Medical History:  Diagnosis Date   Irregular heart beat    MRSA (methicillin resistant staph aureus) culture positive    Nystagmus     Past Surgical History:  Procedure Laterality Date   EYE SURGERY  age 67   Family History:  Family History  Problem Relation Age of Onset   Thyroid disease Maternal Grandmother    Heart Problems Maternal Grandfather    Thyroid disease Maternal Grandfather    Thyroid disease Paternal Grandmother    Heart disease Paternal Grandfather    Thyroid disease Paternal Grandfather    Family Psychiatric  History:  Father- EtOH Abuse No Known Substance Abuse or Suicides  Social History:  Social History   Substance and Sexual Activity  Alcohol Use No   Alcohol/week: 0.0 standard drinks of alcohol     Social History   Substance and Sexual Activity  Drug Use No    Social History   Socioeconomic History   Marital status: Single    Spouse name: Not on file   Number of children: 0   Years of education: HS   Highest education level: Not on file  Occupational History    Employer: OTHER  Tobacco Use   Smoking status: Some Days    Types: Cigars   Smokeless tobacco: Never   Tobacco comments:    2 cigars per month  Substance and Sexual Activity   Alcohol use: No    Alcohol/week: 0.0 standard drinks of alcohol  Drug use: No   Sexual activity: Never  Other Topics Concern   Not on file  Social History Narrative   Patient lives at home with family.   Caffeine Use: 1 cup daily   Social Determinants of Health   Financial Resource Strain: Not on file  Food Insecurity: Patient Declined (09/25/2023)   Hunger Vital Sign    Worried About Running Out of Food in the Last Year: Patient declined    Ran Out of Food in the Last Year: Patient declined  Transportation Needs: Patient Declined (09/25/2023)   PRAPARE - Scientist, research (physical sciences) (Medical): Patient declined    Lack of Transportation (Non-Medical): Patient declined  Physical Activity: Not on file  Stress: Not on file  Social Connections: Not on file    Hospital Course:   During the patient's hospitalization, patient had extensive initial psychiatric evaluation, and follow-up psychiatric evaluations every day.  Psychiatric diagnoses provided upon initial assessment: MDD, Recurrent, Severe, w/out psychosis, GAD, PTSD, and Autism.   Patient's psychiatric medications were adjusted on admission: Restarted on Prozac, as needed Propanolol, and as needed Hydroxyzine.   During the hospitalization, other adjustments were made to the patient's psychiatric medication regimen: His Prozac was titrated to his prior home dose.   Patient's care was discussed during the interdisciplinary team meeting every day during the hospitalization.  The patient is not having side effects to prescribed psychiatric medication.  Gradually, patient started adjusting to milieu. The patient was evaluated each day by a clinical provider to ascertain response to treatment. Improvement was noted by the patient's report of decreasing symptoms, improved sleep and appetite, affect, medication tolerance, behavior, and participation in unit programming.  Patient was asked each day to complete a self inventory noting mood, mental status, pain, new symptoms, anxiety and concerns.   Symptoms were reported as significantly decreased or resolved completely by discharge.  The patient reports that their mood is stable.  The patient denied having suicidal thoughts for more than 48 hours prior to discharge.  Patient denies having homicidal thoughts.  Patient denies having auditory hallucinations.  Patient denies any visual hallucinations or other symptoms of psychosis.  The patient was motivated to continue taking medication with a goal of continued improvement in mental health.   The patient reports  their target psychiatric symptoms of depression and anxiety responded well to the psychiatric medications, and the patient reports overall benefit other psychiatric hospitalization. Supportive psychotherapy was provided to the patient. The patient also participated in regular group therapy while hospitalized. Coping skills, problem solving as well as relaxation therapies were also part of the unit programming.  Labs were reviewed with the patient, and abnormal results were discussed with the patient.  The patient is able to verbalize their individual safety plan to this provider.  # It is recommended to the patient to continue psychiatric medications as prescribed, after discharge from the hospital.    # It is recommended to the patient to follow up with your outpatient psychiatric provider and PCP.  # It was discussed with the patient, the impact of alcohol, drugs, tobacco have been there overall psychiatric and medical wellbeing, and total abstinence from substance use was recommended the patient.ed.  # Prescriptions provided or sent directly to preferred pharmacy at discharge. Patient agreeable to plan. Given opportunity to ask questions. Appears to feel comfortable with discharge.    # In the event of worsening symptoms, the patient is instructed to call the crisis hotline, 911 and  or go to the nearest ED for appropriate evaluation and treatment of symptoms. To follow-up with primary care provider for other medical issues, concerns and or health care needs  # Patient was discharged home with a plan to follow up as noted below.    On day of discharge he reports feeling good.  He reports he has had no side effects from restarting his Prozac.  Discussed with him that his mother and brother were contacted yesterday and that both of them had no safety concerns so we would plan for discharge today.  Discussed with him that since he is discharging on the weekend we would be unable to schedule a  follow-up appointment with him but we would be able to provide him with a list of outpatient providers for medication management and therapy.  Discussed with him the importance of calling Monday morning to schedule a posthospitalization appointment and he reported understanding and wrote this down in his journal.  Encouraged him to continue taking his medications as prescribed.  Discussed with him what to do in the event of a future crisis.  Discussed that he can go to [facility], go to the nearest ED, or call 911 or [phone].   He reported understanding and had no concerns.  He reports no SI, HI, or AVH.  He reports his sleep is good.  He reports appetite is doing good.  He reports no other concerns present.  He was discharged home with his family.    Physical Findings: AIMS: Facial and Oral Movements Muscles of Facial Expression: None, normal Lips and Perioral Area: None, normal Jaw: None, normal Tongue: None, normal,Extremity Movements Upper (arms, wrists, hands, fingers): None, normal Lower (legs, knees, ankles, toes): None, normal, Trunk Movements Neck, shoulders, hips: None, normal, Overall Severity Severity of abnormal movements (highest score from questions above): None, normal Incapacitation due to abnormal movements: None, normal Patient's awareness of abnormal movements (rate only patient's report): No Awareness, Dental Status Current problems with teeth and/or dentures?: No Does patient usually wear dentures?: No  CIWA:    COWS:     Musculoskeletal: Strength & Muscle Tone: within normal limits Gait & Station: normal Patient leans: N/A   Psychiatric Specialty Exam:  Presentation  General Appearance:  Appropriate for Environment; Casual  Eye Contact: Fair  Speech: Clear and Coherent; Normal Rate  Speech Volume: Normal  Handedness:No data recorded  Mood and Affect  Mood: -- ("good")  Affect: Appropriate; Congruent   Thought Process  Thought Processes: Coherent;  Goal Directed  Descriptions of Associations:Intact  Orientation:Full (Time, Place and Person)  Thought Content:Logical; WDL  History of Schizophrenia/Schizoaffective disorder:No  Duration of Psychotic Symptoms:N/A  Hallucinations:Hallucinations: None  Ideas of Reference:None  Suicidal Thoughts:Suicidal Thoughts: No  Homicidal Thoughts:Homicidal Thoughts: No   Sensorium  Memory: Immediate Fair; Recent Fair  Judgment: Fair  Insight: Fair   Art therapist  Concentration: Good  Attention Span: Good  Recall: Good  Fund of Knowledge: Good  Language: Good   Psychomotor Activity  Psychomotor Activity: Psychomotor Activity: Normal   Assets  Assets: Communication Skills; Desire for Improvement; Resilience; Physical Health; Housing; Social Support   Sleep  Sleep: Sleep: Good Number of Hours of Sleep: 8.5    Physical Exam: Physical Exam Vitals and nursing note reviewed.  Constitutional:      General: He is not in acute distress.    Appearance: Normal appearance. He is normal weight. He is not ill-appearing or toxic-appearing.  HENT:     Head: Normocephalic and atraumatic.  Pulmonary:     Effort: Pulmonary effort is normal.  Musculoskeletal:        General: Normal range of motion.  Neurological:     General: No focal deficit present.     Mental Status: He is alert.    Review of Systems  Respiratory:  Negative for cough and shortness of breath.   Cardiovascular:  Negative for chest pain.  Gastrointestinal:  Negative for abdominal pain, constipation, diarrhea, nausea and vomiting.  Neurological:  Negative for dizziness, weakness and headaches.  Psychiatric/Behavioral:  Negative for depression, hallucinations and suicidal ideas. The patient is not nervous/anxious.    Blood pressure 112/76, pulse 85, temperature 97.7 F (36.5 C), temperature source Oral, resp. rate 18, height 5\' 11"  (1.803 m), weight 111.6 kg, SpO2 99%. Body mass index is  34.31 kg/m.   Social History   Tobacco Use  Smoking Status Some Days   Types: Cigars  Smokeless Tobacco Never  Tobacco Comments   2 cigars per month   Tobacco Cessation:  N/A, patient does not currently use tobacco products   Blood Alcohol level:  Lab Results  Component Value Date   ETH <10 09/24/2023    Metabolic Disorder Labs:  Lab Results  Component Value Date   HGBA1C 5.5 02/11/2021   No results found for: "PROLACTIN" Lab Results  Component Value Date   CHOL 172 02/11/2021   TRIG 108.0 02/11/2021   HDL 53.60 02/11/2021   CHOLHDL 3 02/11/2021   VLDL 21.6 02/11/2021   LDLCALC 97 02/11/2021    See Psychiatric Specialty Exam and Suicide Risk Assessment completed by Attending Physician prior to discharge.  Discharge destination:  Home  Is patient on multiple antipsychotic therapies at discharge:  No   Has Patient had three or more failed trials of antipsychotic monotherapy by history:  No  Recommended Plan for Multiple Antipsychotic Therapies: NA  Discharge Instructions     Diet - low sodium heart healthy   Complete by: As directed    Increase activity slowly   Complete by: As directed       Allergies as of 09/27/2023   No Known Allergies      Medication List     STOP taking these medications    FLUoxetine 20 MG tablet Commonly known as: PROZAC Replaced by: FLUoxetine 20 MG capsule       TAKE these medications      Indication  FLUoxetine 20 MG capsule Commonly known as: PROZAC Take 1 capsule (20 mg total) by mouth daily. Start taking on: September 28, 2023 Replaces: FLUoxetine 20 MG tablet  Indication: Generalized Anxiety Disorder, Major Depressive Disorder   hydrOXYzine 25 MG tablet Commonly known as: ATARAX Take 1 tablet (25 mg total) by mouth 3 (three) times daily as needed for anxiety. What changed:  when to take this reasons to take this additional instructions  Indication: Feeling Anxious   propranolol 10 MG  tablet Commonly known as: INDERAL Take 1 tablet (10 mg total) by mouth 2 (two) times daily as needed (Anxiety). What changed: See the new instructions.  Indication: Feeling Anxious   traZODone 50 MG tablet Commonly known as: DESYREL Take 1 tablet (50 mg total) by mouth at bedtime as needed for sleep.  Indication: Trouble Sleeping         Follow-up recommendations/Comments:   Activity: as tolerated   Diet: heart healthy   Other: -Follow-up with your outpatient psychiatric provider -instructions on appointment date, time, and address (location) are provided to you in discharge  paperwork.   -Take your psychiatric medications as prescribed at discharge - instructions are provided to you in the discharge paperwork   -Follow-up with outpatient primary care doctor and other specialists -for management of chronic medical disease, including: Call outpatient psychiatrist and therapist offices on Monday morning to schedule post-hospitalization follow up appointments.   -Testing: Follow-up with outpatient provider for abnormal lab results: None   -Recommend abstinence from alcohol, tobacco, and other illicit drug use at discharge.    -If your psychiatric symptoms recur, worsen, or if you have side effects to your psychiatric medications, call your outpatient psychiatric provider, 911, [phone] or go to the nearest emergency department.   -If suicidal thoughts recur, call your outpatient psychiatric provider, 911, 988 or go to the nearest emergency department.    Signed: Lauro Franklin, MD 09/27/2023, 10:40 AM

## 2023-09-28 ENCOUNTER — Telehealth: Payer: Self-pay | Admitting: Family Medicine

## 2023-09-28 NOTE — Telephone Encounter (Signed)
Patient called to find out who he should schedule his post hospitalization appt with after being discharged from the behavioral health hospital. Spoke with clinic manager and provided him with the phone number to [facility] to set up appt with psychiatrist.

## 2023-10-23 ENCOUNTER — Telehealth: Payer: Self-pay | Admitting: Family Medicine

## 2023-10-23 NOTE — Telephone Encounter (Signed)
Vickey from [facility] called and stated that the patient is wanting occupational Therapy treatment and they will be faxing over a referral for pcp to sign. Please advise. Their contact number Is [phone] .

## 2023-10-27 NOTE — Telephone Encounter (Signed)
Xavier Olson will be faxing the form over again- she also asked if we had the office note of when the pt was Dx with Autism?

## 2023-10-27 NOTE — Telephone Encounter (Signed)
Called and left a vm on Xavier Olson machine and let her know that we never received this.

## 2023-10-27 NOTE — Telephone Encounter (Signed)
Xavier Olson called back to follow-up. Additionally, she wanted to know if we have any records for the patient that states he was diagnosed with Autism. Please advise.

## 2023-10-28 ENCOUNTER — Telehealth: Payer: Self-pay | Admitting: Family Medicine

## 2023-10-28 NOTE — Telephone Encounter (Signed)
Rx last filled at the hospital. Pt states he goes back to work on Friday and is needing rx before he goes back.   Medication:  traZODone (DESYREL) 50 MG tablet   propranolol (INDERAL) 10 MG tablet  hydrOXYzine (ATARAX) 25 MG tablet  FLUoxetine (PROZAC) 20 MG capsule   Has the patient contacted their pharmacy? Yes.     Preferred Pharmacy:  [facility] [address], [place], [place] [address]

## 2023-10-29 ENCOUNTER — Telehealth: Payer: Self-pay | Admitting: Family Medicine

## 2023-10-29 MED ORDER — PROPRANOLOL HCL 10 MG PO TABS: 10.0000 mg | ORAL_TABLET | Freq: Two times a day (BID) | ORAL | 0 refills | Status: DC | PRN

## 2023-10-29 MED ORDER — TRAZODONE HCL 50 MG PO TABS: 50.0000 mg | ORAL_TABLET | Freq: Every evening | ORAL | 0 refills | Status: AC | PRN

## 2023-10-29 MED ORDER — HYDROXYZINE HCL 25 MG PO TABS: 25.0000 mg | ORAL_TABLET | Freq: Three times a day (TID) | ORAL | 0 refills | Status: AC | PRN

## 2023-10-29 MED ORDER — FLUOXETINE HCL 20 MG PO CAPS: 20.0000 mg | ORAL_CAPSULE | Freq: Every day | ORAL | 0 refills | Status: AC

## 2023-10-29 NOTE — Telephone Encounter (Signed)
Okay for refills.

## 2023-10-29 NOTE — Addendum Note (Signed)
Addended by: Abbe Amsterdam C on: 10/29/2023 12:22 PM   Modules accepted: Orders

## 2023-10-29 NOTE — Telephone Encounter (Signed)
Charles let Vickie know that we have again never received this form.

## 2023-10-29 NOTE — Telephone Encounter (Signed)
Vickie called back and stated that she faxed the form over on 11/5.

## 2023-10-29 NOTE — Telephone Encounter (Signed)
Pt called back and stated that he is working the rest of the week an really needs his meds. I advised pt of our response policy and that I could send another msg back. Please advise.

## 2023-10-29 NOTE — Telephone Encounter (Signed)
Duplicate message. Will wait for Dr Patsy Lager to respond to the initial message.

## 2023-10-29 NOTE — Telephone Encounter (Signed)
[name] Xavier Olson along with patient called because they are trying to get his Hydroxyzine prescription filled because he is out of meds and he needs it before going to work tomorrow morning.Mom called the behavioral health number but she said the number was for a personal voicemail so she couldn't speak to anyone about Dr. Renaldo Olson refilling meds. Pt would like to know if Dr. Patsy Olson can fill the hydroxyzine going forward or if there is something else that should be done to keep this medication maintained. Please call to advise and if not, Dr. Patsy Olson who he can have fill it.   [facility] on [id number] [facility]

## 2023-11-20 ENCOUNTER — Other Ambulatory Visit: Payer: Self-pay | Admitting: Family Medicine

## 2024-01-28 ENCOUNTER — Encounter (HOSPITAL_BASED_OUTPATIENT_CLINIC_OR_DEPARTMENT_OTHER): Payer: Self-pay | Admitting: Emergency Medicine

## 2024-01-28 ENCOUNTER — Emergency Department (HOSPITAL_BASED_OUTPATIENT_CLINIC_OR_DEPARTMENT_OTHER)
Admission: EM | Admit: 2024-01-28 | Discharge: 2024-01-28 | Disposition: A | Discharge status: Home / Self Care | Payer: MEDICAID | Attending: Emergency Medicine | Admitting: Emergency Medicine

## 2024-01-28 ENCOUNTER — Emergency Department (HOSPITAL_BASED_OUTPATIENT_CLINIC_OR_DEPARTMENT_OTHER): Payer: MEDICAID

## 2024-01-28 ENCOUNTER — Other Ambulatory Visit (HOSPITAL_BASED_OUTPATIENT_CLINIC_OR_DEPARTMENT_OTHER): Payer: Self-pay

## 2024-01-28 ENCOUNTER — Other Ambulatory Visit: Payer: Self-pay

## 2024-01-28 DIAGNOSIS — N132 Hydronephrosis with renal and ureteral calculous obstruction: Secondary | ICD-10-CM | POA: Diagnosis not present

## 2024-01-28 DIAGNOSIS — R1031 Right lower quadrant pain: Secondary | ICD-10-CM | POA: Diagnosis present

## 2024-01-28 DIAGNOSIS — N201 Calculus of ureter: Secondary | ICD-10-CM

## 2024-01-28 LAB — COMPREHENSIVE METABOLIC PANEL
ALT: 22 U/L (ref 0–44)
AST: 19 U/L (ref 15–41)
Albumin: 4.8 g/dL (ref 3.5–5.0)
Alkaline Phosphatase: 62 U/L (ref 38–126)
Anion gap: 12 (ref 5–15)
BUN: 14 mg/dL (ref 6–20)
CO2: 26 mmol/L (ref 22–32)
Calcium: 9.6 mg/dL (ref 8.9–10.3)
Chloride: 100 mmol/L (ref 98–111)
Creatinine, Ser: 1.03 mg/dL (ref 0.61–1.24)
GFR, Estimated: >60 mL/min (ref >60)
Glucose, Bld: 123 mg/dL — ABNORMAL HIGH (ref 70–99)
Potassium: 3.6 mmol/L (ref 3.5–5.1)
Sodium: 138 mmol/L (ref 135–145)
Total Bilirubin: 0.4 mg/dL (ref 0.0–1.2)
Total Protein: 8 g/dL (ref 6.5–8.1)

## 2024-01-28 LAB — URINALYSIS, ROUTINE W REFLEX MICROSCOPIC
Bacteria, UA: NONE SEEN
Bilirubin Urine: NEGATIVE
Glucose, UA: NEGATIVE mg/dL
Ketones, ur: NEGATIVE mg/dL
Leukocytes,Ua: NEGATIVE
Nitrite: NEGATIVE
Protein, ur: 30 mg/dL — AB
RBC / HPF: >50 RBC/hpf (ref 0–5)
Specific Gravity, Urine: 1.029 (ref 1.005–1.030)
pH: 7.5 (ref 5.0–8.0)

## 2024-01-28 LAB — CBC
HCT: 49.3 % (ref 39.0–52.0)
Hemoglobin: 17.4 g/dL — ABNORMAL HIGH (ref 13.0–17.0)
MCH: 30.7 pg (ref 26.0–34.0)
MCHC: 35.3 g/dL (ref 30.0–36.0)
MCV: 86.9 fL (ref 80.0–100.0)
Platelets: 270 10*3/uL (ref 150–400)
RBC: 5.67 MIL/uL (ref 4.22–5.81)
RDW: 11.9 % (ref 11.5–15.5)
WBC: 10.2 10*3/uL (ref 4.0–10.5)
nRBC: 0 % (ref 0.0–0.2)

## 2024-01-28 LAB — LIPASE, BLOOD: Lipase: 36 U/L (ref 11–51)

## 2024-01-28 MED ORDER — KETOROLAC TROMETHAMINE 15 MG/ML IJ SOLN
15.0000 mg | Freq: Once | INTRAMUSCULAR | Status: AC
  Administered 2024-01-28: 15 mg via INTRAVENOUS
  Filled 2024-01-28: qty 1

## 2024-01-28 MED ORDER — OXYCODONE-ACETAMINOPHEN 5-325 MG PO TABS
1.0000 | ORAL_TABLET | Freq: Once | ORAL | Status: AC
  Administered 2024-01-28: 1 via ORAL
  Filled 2024-01-28: qty 1

## 2024-01-28 MED ORDER — OXYCODONE HCL 5 MG PO TABS
5.0000 mg | ORAL_TABLET | ORAL | 0 refills | Status: AC | PRN
Start: 2024-01-28 — End: ?
  Filled 2024-01-28: qty 15, 3d supply, fill #0

## 2024-01-28 MED ORDER — TAMSULOSIN HCL 0.4 MG PO CAPS
0.4000 mg | ORAL_CAPSULE | Freq: Every day | ORAL | 0 refills | Status: AC
  Filled 2024-01-28: qty 10, 10d supply, fill #0

## 2024-01-28 MED ORDER — SODIUM CHLORIDE 0.9 % IV BOLUS
1000.0000 mL | Freq: Once | INTRAVENOUS | Status: AC
  Administered 2024-01-28: 1000 mL via INTRAVENOUS

## 2024-01-28 MED ORDER — ONDANSETRON 4 MG PO TBDP
4.0000 mg | ORAL_TABLET | Freq: Once | ORAL | Status: AC | PRN
  Administered 2024-01-28: 4 mg via ORAL
  Filled 2024-01-28: qty 1

## 2024-01-28 NOTE — ED Triage Notes (Signed)
 Pt to ED from home c/o bilateral flank pain radiating around to abd starting approx 4 hours ago, right side worse now.  Denies v/d, states difficulty urinating and nausea.  Has hx of kidney stones.

## 2024-01-28 NOTE — Discharge Instructions (Signed)
 Please take prescriptions as directed.  I would like for you to follow-up with urology sometime next week.  You may return to the emergency department for any worsening symptoms.

## 2024-01-28 NOTE — ED Provider Notes (Signed)
  EMERGENCY DEPARTMENT AT [facility] Provider Note   CSN: [id number] Arrival date & time: 01/28/24  0114     History Chief Complaint  Patient presents with   Abdominal Pain    Xavier Olson is a 28 y.o. male with history of presents to the emergency department today with right lower quadrant abdominal pain that started last night around 9 PM.  He states that initially started with generalized abdominal pain but is since migrated over to the right side.  Pain does radiate into the right flank.  He denies any fever, chills, urinary symptoms, nausea, vomiting, diarrhea.  He does state this feels similar to when he had a kidney stone in the past.   Abdominal Pain      Home Medications Prior to Admission medications   Medication Sig Start Date End Date Taking? Authorizing Provider  oxyCODONE  (ROXICODONE ) 5 MG immediate release tablet Take 1 tablet (5 mg total) by mouth every 4 (four) hours as needed for severe pain (pain score 7-10). 01/28/24  Yes Theotis Peers M, PA-C  tamsulosin  (FLOMAX ) 0.4 MG CAPS capsule Take 1 capsule (0.4 mg total) by mouth daily. 01/28/24  Yes Theotis, Kentley Blyden M, PA-C  FLUoxetine  (PROZAC ) 20 MG capsule Take 1 capsule (20 mg total) by mouth daily. 10/29/23   Copland, Harlene BROCKS, MD  hydrOXYzine  (ATARAX ) 25 MG tablet Take 1 tablet (25 mg total) by mouth 3 (three) times daily as needed for anxiety. 10/29/23   Copland, Harlene BROCKS, MD  propranolol  (INDERAL ) 10 MG tablet TAKE 1 TABLET (10 MG TOTAL) BY MOUTH 2 (TWO) TIMES DAILY AS NEEDED (ANXIETY). 11/23/23   Copland, Harlene BROCKS, MD  traZODone  (DESYREL ) 50 MG tablet Take 1 tablet (50 mg total) by mouth at bedtime as needed for sleep. 10/29/23   Copland, Jessica C, MD      Allergies    Patient has no known allergies.    Review of Systems   Review of Systems  Gastrointestinal:  Positive for abdominal pain.  All other systems reviewed and are negative.   Physical Exam Updated Vital Signs BP 114/81 (BP  Location: Left Arm)   Pulse (!) 58   Temp 98 F (36.7 C) (Oral)   Resp 16   Ht 5' 10 (1.778 m)   Wt 119.3 kg   SpO2 96%   BMI 37.74 kg/m  Physical Exam Vitals and nursing note reviewed.  Constitutional:      General: He is not in acute distress.    Appearance: Normal appearance.  HENT:     Head: Normocephalic and atraumatic.  Eyes:     General:        Right eye: No discharge.        Left eye: No discharge.  Cardiovascular:     Comments: Regular rate and rhythm.  S1/S2 are distinct without any evidence of murmur, rubs, or gallops.  Radial pulses are 2+ bilaterally.  Dorsalis pedis pulses are 2+ bilaterally.  No evidence of pedal edema. Pulmonary:     Comments: Clear to auscultation bilaterally.  Normal effort.  No respiratory distress.  No evidence of wheezes, rales, or rhonchi heard throughout. Abdominal:     General: Abdomen is flat. Bowel sounds are normal. There is no distension.     Tenderness: There is no abdominal tenderness. There is no right CVA tenderness, left CVA tenderness, guarding or rebound.  Musculoskeletal:        General: Normal range of motion.     Cervical back: Neck  supple.  Skin:    General: Skin is warm and dry.     Findings: No rash.  Neurological:     General: No focal deficit present.     Mental Status: He is alert.  Psychiatric:        Mood and Affect: Mood normal.        Behavior: Behavior normal.     ED Results / Procedures / Treatments   Labs (all labs ordered are listed, but only abnormal results are displayed) Labs Reviewed  COMPREHENSIVE METABOLIC PANEL - Abnormal; Notable for the following components:      Result Value   Glucose, Bld 123 (*)    All other components within normal limits  CBC - Abnormal; Notable for the following components:   Hemoglobin 17.4 (*)    All other components within normal limits  URINALYSIS, ROUTINE W REFLEX MICROSCOPIC - Abnormal; Notable for the following components:   APPearance CLOUDY (*)    Hgb  urine dipstick LARGE (*)    Protein, ur 30 (*)    All other components within normal limits  LIPASE, BLOOD    EKG None  Radiology CT Renal Stone Study Result Date: 01/28/2024 CLINICAL DATA:  Bilateral flank pain EXAM: CT ABDOMEN AND PELVIS WITHOUT CONTRAST TECHNIQUE: Multidetector CT imaging of the abdomen and pelvis was performed following the standard protocol without IV contrast. RADIATION DOSE REDUCTION: This exam was performed according to the departmental dose-optimization program which includes automated exposure control, adjustment of the mA and/or kV according to patient size and/or use of iterative reconstruction technique. COMPARISON:  04/21/2011 FINDINGS: Lower chest: No acute abnormality. Hepatobiliary: No focal liver abnormality is seen. No gallstones, gallbladder wall thickening, or biliary dilatation. Pancreas: Unremarkable Spleen: Unremarkable Adrenals/Urinary Tract: Adrenal glands are unremarkable. The kidneys are normal in size and position. There is mild right hydronephrosis secondary to obstructing 3 x 5 mm calculus within the distal right ureter the level of the ureterovesicular junction. 3 mm nonobstructing calculus is seen within the interpolar region of the left kidney. No hydronephrosis on the left simple cortical cyst noted within the lower pole the left kidney for which no follow-up imaging is recommended. The bladder is decompressed and is unremarkable. Stomach/Bowel: Stomach is within normal limits. Appendix appears normal. No evidence of bowel wall thickening, distention, or inflammatory changes. Vascular/Lymphatic: No significant vascular findings are present. No enlarged abdominal or pelvic lymph nodes. Reproductive: Prostate is unremarkable. Other: No abdominal wall hernia or abnormality. No abdominopelvic ascites. Musculoskeletal: No acute or significant osseous findings. IMPRESSION: 1. Obstructing 3 x 5 mm calculus within the distal right ureter at the level of the  ureterovesicular junction resulting in mild right hydronephrosis. 2. Minimal nonobstructing left nephrolithiasis. Electronically Signed   By: Dorethia Molt M.D.   On: 01/28/2024 03:00    Procedures Procedures    Medications Ordered in ED Medications  ondansetron  (ZOFRAN -ODT) disintegrating tablet 4 mg (4 mg Oral Given 01/28/24 0209)  oxyCODONE -acetaminophen  (PERCOCET/ROXICET) 5-325 MG per tablet 1 tablet (1 tablet Oral Given 01/28/24 0209)  sodium chloride  0.9 % bolus 1,000 mL (1,000 mLs Intravenous New Bag/Given 01/28/24 0958)  ketorolac  (TORADOL ) 15 MG/ML injection 15 mg (15 mg Intravenous Given 01/28/24 0957)    ED Course/ Medical Decision Making/ A&P Clinical Course as of 01/28/24 1119  Thu Jan 28, 2024  1111 On reevaluation, patient is doing much better from a pain perspective.  Bolus of fluids as finished. [CF]  1111 Comprehensive metabolic panel(!) Normal. [CF]  1111 Urinalysis, Routine  w reflex microscopic -Urine, Clean Catch(!) No signs of infection but there is a large amount of hematuria likely secondary to the ureteralithiasis. [CF]  1112 Lipase, blood Negative [CF]  1112 CBC(!) No signs of leukocytosis. [CF]    Clinical Course User Index [CF] Theotis Cameron HERO, PA-C   {   Click here for ABCD2, HEART and other calculatorsMedical Decision Making York Valliant is a 28 y.o. male patient who presents to the emergency department today for further evaluation of right sided abdominal pain.  Differential diagnosis does include appendicitis, diverticulitis, gastroenteritis.  Patient is currently resting comfortably in the emergency department.  Vital signs are normal.  Labs and imaging were performed while the patient was in triage.  I interpreted.  To a denies any urinary anesthesia.  Or any signs of sepsis today.  I reviewed the CT scan which does reveal a 3 mm stone in the distal right ureter.  This is likely the source of his symptoms.  Stone does not appear to be infected based  on urinalysis.  Will plan to give him some Toradol  and observe.  Plan to discharge with urology follow-up.  Will prescribe the patient some narcotic pain medication and Flomax  to go home with.  I will also give him urology follow-up.  Strict return precautions were discussed.  He is safe for discharge at this time.  Amount and/or Complexity of Data Reviewed Labs: ordered. Decision-making details documented in ED Course.  Risk Prescription drug management.    Final Clinical Impression(s) / ED Diagnoses Final diagnoses:  Ureterolithiasis    Rx / DC Orders ED Discharge Orders          Ordered    tamsulosin  (FLOMAX ) 0.4 MG CAPS capsule  Daily        01/28/24 1115    oxyCODONE  (ROXICODONE ) 5 MG immediate release tablet  Every 4 hours PRN        01/28/24 1116              Theotis Cameron [place], PA-C 01/28/24 1119    Geraldene Hamilton, MD 01/29/24 [id number]
